# Patient Record
Sex: Male | Born: 1975
Health system: Southern US, Community
[De-identification: ages and names within clinical notes are randomized; demographics above are authoritative.]

## PROBLEM LIST (undated history)

## (undated) DIAGNOSIS — Z800 Family history of malignant neoplasm of digestive organs: Secondary | ICD-10-CM

## (undated) DIAGNOSIS — F1911 Other psychoactive substance abuse, in remission: Secondary | ICD-10-CM

## (undated) DIAGNOSIS — G4700 Insomnia, unspecified: Secondary | ICD-10-CM

## (undated) DIAGNOSIS — R21 Rash and other nonspecific skin eruption: Secondary | ICD-10-CM

## (undated) DIAGNOSIS — J310 Chronic rhinitis: Secondary | ICD-10-CM

## (undated) DIAGNOSIS — G8929 Other chronic pain: Secondary | ICD-10-CM

## (undated) DIAGNOSIS — M6740 Ganglion, unspecified site: Secondary | ICD-10-CM

## (undated) DIAGNOSIS — M549 Dorsalgia, unspecified: Secondary | ICD-10-CM

## (undated) HISTORY — DX: Rash and other nonspecific skin eruption: R21

## (undated) HISTORY — DX: Other psychoactive substance abuse, in remission: F19.11

## (undated) HISTORY — DX: Chronic rhinitis: J31.0

## (undated) HISTORY — DX: Family history of malignant neoplasm of digestive organs: Z80.0

## (undated) HISTORY — DX: Insomnia, unspecified: G47.00

## (undated) HISTORY — DX: Other chronic pain: G89.29

## (undated) HISTORY — DX: Dorsalgia, unspecified: M54.9

## (undated) HISTORY — PX: OTHER SURGICAL HISTORY: SHX169

## (undated) HISTORY — DX: Ganglion, unspecified site: M67.40

---

## 1997-09-30 ENCOUNTER — Emergency Department (HOSPITAL_COMMUNITY): Admission: EM | Admit: 1997-09-30 | Discharge: 1997-09-30 | Payer: Self-pay | Admitting: Emergency Medicine

## 1997-12-23 ENCOUNTER — Emergency Department (HOSPITAL_COMMUNITY): Admission: EM | Admit: 1997-12-23 | Discharge: 1997-12-23 | Payer: Self-pay | Admitting: Internal Medicine

## 1998-05-03 ENCOUNTER — Emergency Department (HOSPITAL_COMMUNITY): Admission: EM | Admit: 1998-05-03 | Discharge: 1998-05-03 | Payer: Self-pay | Admitting: Emergency Medicine

## 1998-05-03 ENCOUNTER — Encounter: Payer: Self-pay | Admitting: Emergency Medicine

## 1999-07-24 ENCOUNTER — Emergency Department (HOSPITAL_COMMUNITY): Admission: EM | Admit: 1999-07-24 | Discharge: 1999-07-24 | Payer: Self-pay | Admitting: Emergency Medicine

## 2003-07-15 ENCOUNTER — Inpatient Hospital Stay (HOSPITAL_COMMUNITY): Admission: EM | Admit: 2003-07-15 | Discharge: 2003-07-18 | Payer: Self-pay | Admitting: Psychiatry

## 2003-07-15 ENCOUNTER — Emergency Department (HOSPITAL_COMMUNITY): Admission: EM | Admit: 2003-07-15 | Discharge: 2003-07-15 | Payer: Self-pay | Admitting: Emergency Medicine

## 2004-03-05 ENCOUNTER — Emergency Department (HOSPITAL_COMMUNITY): Admission: EM | Admit: 2004-03-05 | Discharge: 2004-03-05 | Payer: Self-pay | Admitting: Emergency Medicine

## 2005-11-12 ENCOUNTER — Encounter: Admission: RE | Admit: 2005-11-12 | Discharge: 2006-02-10 | Payer: Self-pay | Admitting: Anesthesiology

## 2005-11-16 ENCOUNTER — Ambulatory Visit: Payer: Self-pay | Admitting: Anesthesiology

## 2005-11-16 ENCOUNTER — Encounter: Payer: Self-pay | Admitting: Family Medicine

## 2005-12-17 ENCOUNTER — Encounter: Admission: RE | Admit: 2005-12-17 | Discharge: 2005-12-17 | Payer: Self-pay | Admitting: Gastroenterology

## 2005-12-22 ENCOUNTER — Encounter: Admission: RE | Admit: 2005-12-22 | Discharge: 2005-12-22 | Payer: Self-pay | Admitting: Gastroenterology

## 2006-02-15 ENCOUNTER — Inpatient Hospital Stay (HOSPITAL_COMMUNITY): Admission: RE | Admit: 2006-02-15 | Discharge: 2006-02-18 | Payer: Self-pay | Admitting: Neurosurgery

## 2006-08-15 ENCOUNTER — Encounter: Admission: RE | Admit: 2006-08-15 | Discharge: 2006-11-13 | Payer: Self-pay | Admitting: Anesthesiology

## 2006-08-16 ENCOUNTER — Ambulatory Visit: Payer: Self-pay | Admitting: Anesthesiology

## 2006-10-18 ENCOUNTER — Ambulatory Visit: Payer: Self-pay | Admitting: Anesthesiology

## 2006-12-19 ENCOUNTER — Encounter: Admission: RE | Admit: 2006-12-19 | Discharge: 2006-12-20 | Payer: Self-pay | Admitting: Anesthesiology

## 2006-12-20 ENCOUNTER — Ambulatory Visit: Payer: Self-pay | Admitting: Anesthesiology

## 2006-12-20 ENCOUNTER — Encounter: Payer: Self-pay | Admitting: Family Medicine

## 2007-01-02 ENCOUNTER — Encounter
Admission: RE | Admit: 2007-01-02 | Discharge: 2007-04-02 | Payer: Self-pay | Admitting: Physical Medicine & Rehabilitation

## 2007-01-03 ENCOUNTER — Ambulatory Visit: Payer: Self-pay | Admitting: Physical Medicine & Rehabilitation

## 2007-02-08 ENCOUNTER — Ambulatory Visit: Payer: Self-pay | Admitting: Physical Medicine & Rehabilitation

## 2007-02-09 ENCOUNTER — Encounter: Payer: Self-pay | Admitting: Family Medicine

## 2007-03-09 ENCOUNTER — Ambulatory Visit: Payer: Self-pay | Admitting: Physical Medicine & Rehabilitation

## 2007-05-05 ENCOUNTER — Encounter
Admission: RE | Admit: 2007-05-05 | Discharge: 2007-05-08 | Payer: Self-pay | Admitting: Physical Medicine & Rehabilitation

## 2007-05-05 ENCOUNTER — Ambulatory Visit: Payer: Self-pay | Admitting: Physical Medicine & Rehabilitation

## 2007-07-27 ENCOUNTER — Encounter
Admission: RE | Admit: 2007-07-27 | Discharge: 2007-08-10 | Payer: Self-pay | Admitting: Physical Medicine & Rehabilitation

## 2007-08-10 ENCOUNTER — Ambulatory Visit: Payer: Self-pay | Admitting: Physical Medicine & Rehabilitation

## 2007-09-26 ENCOUNTER — Encounter: Payer: Self-pay | Admitting: Family Medicine

## 2007-11-01 ENCOUNTER — Encounter
Admission: RE | Admit: 2007-11-01 | Discharge: 2007-11-02 | Payer: Self-pay | Admitting: Physical Medicine & Rehabilitation

## 2007-11-02 ENCOUNTER — Ambulatory Visit: Payer: Self-pay | Admitting: Physical Medicine & Rehabilitation

## 2008-01-24 ENCOUNTER — Encounter
Admission: RE | Admit: 2008-01-24 | Discharge: 2008-04-23 | Payer: Self-pay | Admitting: Physical Medicine & Rehabilitation

## 2008-01-25 ENCOUNTER — Ambulatory Visit: Payer: Self-pay | Admitting: Physical Medicine & Rehabilitation

## 2008-03-07 ENCOUNTER — Encounter: Payer: Self-pay | Admitting: Family Medicine

## 2008-03-07 ENCOUNTER — Ambulatory Visit: Payer: Self-pay | Admitting: Physical Medicine & Rehabilitation

## 2008-05-03 ENCOUNTER — Encounter
Admission: RE | Admit: 2008-05-03 | Discharge: 2008-05-03 | Payer: Self-pay | Admitting: Physical Medicine & Rehabilitation

## 2008-05-03 ENCOUNTER — Ambulatory Visit: Payer: Self-pay | Admitting: Physical Medicine & Rehabilitation

## 2008-07-24 ENCOUNTER — Encounter
Admission: RE | Admit: 2008-07-24 | Discharge: 2008-10-22 | Payer: Self-pay | Admitting: Physical Medicine & Rehabilitation

## 2008-08-05 ENCOUNTER — Ambulatory Visit: Payer: Self-pay | Admitting: Physical Medicine & Rehabilitation

## 2008-09-17 ENCOUNTER — Encounter: Payer: Self-pay | Admitting: Family Medicine

## 2008-09-17 ENCOUNTER — Ambulatory Visit: Payer: Self-pay | Admitting: Physical Medicine & Rehabilitation

## 2008-11-12 ENCOUNTER — Encounter
Admission: RE | Admit: 2008-11-12 | Discharge: 2008-11-19 | Payer: Self-pay | Admitting: Physical Medicine & Rehabilitation

## 2008-11-19 ENCOUNTER — Ambulatory Visit: Payer: Self-pay | Admitting: Physical Medicine & Rehabilitation

## 2008-11-19 ENCOUNTER — Encounter: Payer: Self-pay | Admitting: Family Medicine

## 2008-12-03 ENCOUNTER — Encounter: Payer: Self-pay | Admitting: Family Medicine

## 2009-06-30 ENCOUNTER — Encounter: Payer: Self-pay | Admitting: Family Medicine

## 2009-10-08 ENCOUNTER — Telehealth: Payer: Self-pay | Admitting: Family Medicine

## 2009-10-16 ENCOUNTER — Ambulatory Visit: Payer: Self-pay | Admitting: Family Medicine

## 2009-10-16 DIAGNOSIS — M549 Dorsalgia, unspecified: Secondary | ICD-10-CM | POA: Insufficient documentation

## 2009-10-16 DIAGNOSIS — G4700 Insomnia, unspecified: Secondary | ICD-10-CM | POA: Insufficient documentation

## 2009-11-18 ENCOUNTER — Telehealth: Payer: Self-pay | Admitting: Family Medicine

## 2009-11-24 ENCOUNTER — Ambulatory Visit: Payer: Self-pay | Admitting: Family Medicine

## 2009-11-24 DIAGNOSIS — L989 Disorder of the skin and subcutaneous tissue, unspecified: Secondary | ICD-10-CM | POA: Insufficient documentation

## 2009-11-24 DIAGNOSIS — R21 Rash and other nonspecific skin eruption: Secondary | ICD-10-CM

## 2009-12-16 ENCOUNTER — Ambulatory Visit: Payer: Self-pay | Admitting: Family Medicine

## 2009-12-16 DIAGNOSIS — B9789 Other viral agents as the cause of diseases classified elsewhere: Secondary | ICD-10-CM | POA: Insufficient documentation

## 2009-12-18 ENCOUNTER — Telehealth: Payer: Self-pay | Admitting: Family Medicine

## 2009-12-23 ENCOUNTER — Telehealth: Payer: Self-pay | Admitting: Family Medicine

## 2010-01-16 ENCOUNTER — Telehealth: Payer: Self-pay | Admitting: Family Medicine

## 2010-01-21 ENCOUNTER — Telehealth: Payer: Self-pay | Admitting: Family Medicine

## 2010-02-17 ENCOUNTER — Telehealth: Payer: Self-pay | Admitting: Family Medicine

## 2010-02-23 ENCOUNTER — Telehealth: Payer: Self-pay | Admitting: Family Medicine

## 2010-03-19 ENCOUNTER — Telehealth: Payer: Self-pay | Admitting: Family Medicine

## 2010-03-24 ENCOUNTER — Telehealth: Payer: Self-pay | Admitting: Family Medicine

## 2010-04-17 ENCOUNTER — Telehealth: Payer: Self-pay | Admitting: Family Medicine

## 2010-05-04 ENCOUNTER — Ambulatory Visit
Admission: RE | Admit: 2010-05-04 | Discharge: 2010-05-04 | Payer: Self-pay | Source: Home / Self Care | Attending: Family Medicine | Admitting: Family Medicine

## 2010-05-04 DIAGNOSIS — M6740 Ganglion, unspecified site: Secondary | ICD-10-CM | POA: Insufficient documentation

## 2010-05-04 DIAGNOSIS — J310 Chronic rhinitis: Secondary | ICD-10-CM | POA: Insufficient documentation

## 2010-05-05 NOTE — Letter (Signed)
Summary: [name] @ [facility] [employer]   Imported By: Lanelle Bal 11/10/2009 09:02:14  _____________________________________________________________________  External Attachment:    Type:   Image     Comment:   External Document

## 2010-05-05 NOTE — Progress Notes (Signed)
Summary: refill request for norco  Phone Note Refill Request Message from:  Fax from Pharmacy  Refills Requested: Medication #1:  NORCO 10-325 MG TABS Take 1/2 tablet by mouth twice a day   Last Refilled: 12/18/2009 Faxed request from [facility] [address], [phone]  Initial call taken by: Lowella Petties CMA,  January 16, 2010 8:57 AM  Follow-up for Phone Call        please call in and notify patient.  Follow-up by: Crawford Givens MD,  January 16, 2010 1:40 PM  Additional Follow-up for Phone Call Additional follow up Details #1::        Rx called in as directed. Message left notifying patient on home machine Additional Follow-up by: Janee Morn CMA Duncan Dull),  January 16, 2010 2:25 PM    Prescriptions: NORCO 10-325 MG TABS (HYDROCODONE-ACETAMINOPHEN) Take 1/2 tablet by mouth twice a day  #30 x 0   Entered and Authorized by:   Crawford Givens MD   Signed by:   Crawford Givens MD on 01/16/2010   Method used:   Telephoned to ...       [facility] #[id number]* (retail)       [address]       [place], [place]  [address]       Ph: [phone]       Fax: [phone]   RxID:   [id number]

## 2010-05-05 NOTE — Assessment & Plan Note (Signed)
Summary: RASHES   Vital Signs:  Patient profile:   35 year old male Height:      74 inches Weight:      222.25 pounds BMI:     28.64 Temp:     97.4 degrees F oral Pulse rate:   92 / minute Pulse rhythm:   regular BP sitting:   92 / 64  (left arm) Cuff size:   large  Vitals Entered By: Delilah Shan CMA Duncan Dull) (November 24, 2009 10:49 AM) CC: Rashes   History of Present Illness: Started in last 2-3 days.  Itchy spot on L wrist, then on R ankle and B at waistline.  Itches less at night.  Itching comes and goes.  No FCNAV.  Was at golf tournament and had no known environmental exposure that could have caused it.  No new soaps or other new foods.  No known triggers.    Allergies: No Known Drug Allergies  Review of Systems       See HPI.  Otherwise negative.    Physical Exam  General:  NAD RRR CTAB skin with maculopapular lesions/patches on waist at bilateral midaxillary line.  Similary blanching patch on the R ankle, inferior to lat mal.  Small excoriated lesion on wrist noted.    Impression & Recommendations:  Problem # 1:  SKIN RASH (ICD-782.1)  Pt to use TAC and claritin as needed.  I think this is likely a self resolving dermatitis but these may help with symptoms in the meantime.  follow up as needed. He agrees.  Steroid precautions given.   His updated medication list for this problem includes:    Triamcinolone Acetonide 0.1 % Crea (Triamcinolone acetonide) .Marland Kitchen... Aaa two or three times a day  Complete Medication List: 1)  Tramadol Hcl 50 Mg Tabs (Tramadol hcl) .... 2 by mouth four times a day x 30 days 2)  Norco 10-325 Mg Tabs (Hydrocodone-acetaminophen) .... Take 1/2 tablet by mouth twice a day 3)  Flexeril 10 Mg Tabs (Cyclobenzaprine hcl) .... Take 1 to 1.5 tablet by mouth once a day for spasm or for sleep 4)  Multivitamins Tabs (Multiple vitamin) .... Take 1 tablet by mouth once a day 5)  Garlic Powd (Garlic) .... Take 1 tablet by mouth once a day 6)  Fish Oil  Oil (Fish oil) .... Once daily 7)  Aleve 220 Mg Tabs (Naproxen sodium) .... Takes 3 tabs daily 8)  Zantac 150 Mg Tabs (Ranitidine hcl) .... Take 1 tablet by mouth once a day 9)  Celexa 40 Mg Tabs (Citalopram hydrobromide) .Marland Kitchen.. 1 by mouth qday 10)  Triamcinolone Acetonide 0.1 % Crea (Triamcinolone acetonide) .... Aaa two or three times a day  Patient Instructions: 1)  Call back next week if you aren't doing better.  Use claritin 10mg  a day and the cream as needed.   Prescriptions: TRIAMCINOLONE ACETONIDE 0.1 % CREA (TRIAMCINOLONE ACETONIDE) AAA two or three times a day  #15g x 2   Entered and Authorized by:   Crawford Givens MD   Signed by:   Crawford Givens MD on 11/24/2009   Method used:   Electronically to        [facility] #[id number]* (retail)       [address]       [place], [place]  [address]       Ph: [phone]       Fax: [phone]   RxID:  [id number]   Current Allergies (reviewed today): No known allergies

## 2010-05-05 NOTE — Progress Notes (Signed)
Summary: hydrocodone  Phone Note Refill Request Message from:  Fax from Pharmacy on February 17, 2010 10:29 AM  Refills Requested: Medication #1:  NORCO 10-325 MG TABS Take 1/2 tablet by mouth twice a day   Last Refilled: 01/16/2010 Refill request from [facility] [address] [phone].   Initial call taken by: Melody Comas,  February 17, 2010 10:40 AM  Follow-up for Phone Call        please call in.  Follow-up by: Crawford Givens MD,  February 17, 2010 1:02 PM  Additional Follow-up for Phone Call Additional follow up Details #1::        Medication phoned to pharmacy.  Additional Follow-up by: Delilah Shan CMA Duncan Dull),  February 17, 2010 3:18 PM    Prescriptions: NORCO 10-325 MG TABS (HYDROCODONE-ACETAMINOPHEN) Take 1/2 tablet by mouth twice a day  #30 x 0   Entered and Authorized by:   Crawford Givens MD   Signed by:   Crawford Givens MD on 02/17/2010   Method used:   Telephoned to ...       [facility] #[id number]* (retail)       [address]       [place], [place]  [address]       Ph: [phone]       Fax: [phone]   RxID:   [id number]

## 2010-05-05 NOTE — Progress Notes (Signed)
Summary: tramadol    Phone Note Refill Request Message from:  Fax from Pharmacy on February 23, 2010 9:49 AM  Refills Requested: Medication #1:  TRAMADOL HCL 50 MG TABS 2 by mouth four times a day x 30 days   Last Refilled: 01/21/2010 Refill request from [facility] [phone].   Initial call taken by: Melody Comas,  February 23, 2010 9:50 AM  Follow-up for Phone Call        please call in.  Follow-up by: Crawford Givens MD,  February 23, 2010 12:33 PM  Additional Follow-up for Phone Call Additional follow up Details #1::        Medication phoned to pharmacy.  Additional Follow-up by: Delilah Shan CMA Duncan Dull),  February 23, 2010 3:10 PM    Prescriptions: TRAMADOL HCL 50 MG TABS (TRAMADOL HCL) 2 by mouth four times a day x 30 days  #240 x 0   Entered and Authorized by:   Crawford Givens MD   Signed by:   Crawford Givens MD on 02/23/2010   Method used:   Telephoned to ...       [facility] #[id number]* (retail)       [address]       [place], [place]  [address]       Ph: [phone]       Fax: [phone]   RxID:   [id number]

## 2010-05-05 NOTE — Progress Notes (Signed)
Summary: refill requests for tramadol, norco  Phone Note Refill Request Message from:  Fax from Pharmacy  Refills Requested: Medication #1:  TRAMADOL HCL 50 MG TABS 2 by mouth four times a day x 30 days   Last Refilled: 09/15/2009  Medication #2:  NORCO 10-325 MG TABS Take 1/2 tablet by mouth twice a day   Last Refilled: 10/16/2009 Faxed requests from [facility] [address], [phone]  Initial call taken by: Lowella Petties CMA,  November 18, 2009 9:41 AM  Follow-up for Phone Call        signed,  okay to mail to patient if he prefers.  Follow-up by: Crawford Givens MD,  November 18, 2009 10:29 AM  Additional Follow-up for Phone Call Additional follow up Details #1::        Faxed to pharmacy Additional Follow-up by: Delilah Shan CMA Duncan Dull),  November 18, 2009 11:35 AM    Prescriptions: NORCO 10-325 MG TABS (HYDROCODONE-ACETAMINOPHEN) Take 1/2 tablet by mouth twice a day  #30 x 0   Entered and Authorized by:   Crawford Givens MD   Signed by:   Crawford Givens MD on 11/18/2009   Method used:   Print then Give to Patient   RxID:   [id number] TRAMADOL HCL 50 MG TABS (TRAMADOL HCL) 2 by mouth four times a day x 30 days  #240 x 0   Entered and Authorized by:   Crawford Givens MD   Signed by:   Crawford Givens MD on 11/18/2009   Method used:   Print then Give to Patient   RxID:   [id number]

## 2010-05-05 NOTE — Op Note (Signed)
Summary: [facility] Steroid [facility]  [facility] Steroid Injection/[facility]   Imported By: Lanelle Bal 11/10/2009 08:57:25  _____________________________________________________________________  External Attachment:    Type:   Image     Comment:   External Document

## 2010-05-05 NOTE — Miscellaneous (Signed)
Summary: Controlled Substances Contract  Controlled Substances Contract   Imported By: Maryln Gottron 10/23/2009 15:52:44  _____________________________________________________________________  External Attachment:    Type:   Image     Comment:   External Document

## 2010-05-05 NOTE — Op Note (Signed)
Summary: Lumbar Epidural/[facility]  Lumbar Epidural/[facility]   Imported By: Lanelle Bal 11/10/2009 08:55:35  _____________________________________________________________________  External Attachment:    Type:   Image     Comment:   External Document

## 2010-05-05 NOTE — Letter (Signed)
Summary: [name] @ [facility] [employer]   Imported By: Lanelle Bal 11/10/2009 09:03:11  _____________________________________________________________________  External Attachment:    Type:   Image     Comment:   External Document

## 2010-05-05 NOTE — Letter (Signed)
Summary: [facility]  [facility]   Imported By: Lanelle Bal 11/10/2009 08:59:55  _____________________________________________________________________  External Attachment:    Type:   Image     Comment:   External Document

## 2010-05-05 NOTE — Progress Notes (Signed)
Summary: needs refill on tramadol  Phone Note Call from Patient Call back at [phone]   Caller: Patient Call For: Dr. Para March Summary of Call: Pt is a pt of yours from [facility], he has his first appt here next week.  He is running out of tramadol and needs a written prescription to pick up.  He takes 50 mg's, 2 tablets four times a day.             Lowella Petties CMA  October 08, 2009 9:30 AM   Follow-up for Phone Call        please send in rx for tramadol 50mg , 2 by mouth qid x30 days.  #240.  no rf.  thanks.  Follow-up by: Crawford Givens MD,  October 08, 2009 2:04 PM  Additional Follow-up for Phone Call Additional follow up Details #1::        Patient Advised.   Patient would like it printed for pick up.  He is not sure which pharmacy he'll use and will be in the area this afternoon. Additional Follow-up by: Delilah Shan CMA Nikala Walsworth Dull),  October 08, 2009 2:32 PM    Additional Follow-up for Phone Call Additional follow up Details #2::    printed.  signed.  Follow-up by: Crawford Givens MD,  October 08, 2009 2:38 PM  New/Updated Medications: TRAMADOL HCL 50 MG TABS (TRAMADOL HCL) 2 by mouth qid x30 days Prescriptions: TRAMADOL HCL 50 MG TABS (TRAMADOL HCL) 2 by mouth qid x30 days  #240 x 0   Entered and Authorized by:   Crawford Givens MD   Signed by:   Crawford Givens MD on 10/08/2009   Method used:   Print then Give to Patient   RxID:   [id number]

## 2010-05-05 NOTE — Progress Notes (Signed)
Summary: refill request for tramadol  Phone Note Refill Request Message from:  Fax from Pharmacy  Refills Requested: Medication #1:  TRAMADOL HCL 50 MG TABS 2 by mouth four times a day x 30 days   Last Refilled: 12/23/2009 Faxed request from [facility] [address], [phone].  Initial call taken by: Lowella Petties CMA,  January 21, 2010 11:06 AM  Follow-up for Phone Call        please call in.  Follow-up by: Crawford Givens MD,  January 21, 2010 11:22 AM  Additional Follow-up for Phone Call Additional follow up Details #1::        Medication phoned to pharmacy.  Additional Follow-up by: Delilah Shan CMA Duncan Dull),  January 21, 2010 1:47 PM    Prescriptions: TRAMADOL HCL 50 MG TABS (TRAMADOL HCL) 2 by mouth four times a day x 30 days  #240 x 0   Entered and Authorized by:   Crawford Givens MD   Signed by:   Crawford Givens MD on 01/21/2010   Method used:   Telephoned to ...       [facility] #[id number]* (retail)       [address]       [place], [place]  [address]       Ph: [phone]       Fax: [phone]   RxID:   [id number]

## 2010-05-05 NOTE — Assessment & Plan Note (Signed)
Summary: TRANSFER FROM [facility]   Vital Signs:  Patient profile:   35 year old male Height:      74 inches Weight:      218.25 pounds BMI:     28.12 Temp:     98.1 degrees F oral Pulse rate:   92 / minute Pulse rhythm:   regular BP sitting:   104 / 70  (left arm) Cuff size:   large  Vitals Entered By: Delilah Shan CMA  Dull) (October 16, 2009 2:58 PM) CC: Transfer from [facility]   History of Present Illness: Back pain.  Long standing.  Prev Lumbar surgery and was seen at pain clinic.  on stable regimen of meds wtih good relief of pain as long as he is active and stretching.  No current alcohol/illicit use.  Pain is in lower lumbar area in midline.  No new neurologic symptoms.   Trouble sleeping.  Going on for about 6 months to 1 year.  Taking flexeril at night.  Has cut out caffeine in the afternoon.  Trouble getting to sleep and staying asleep.  Waking up tired the next morning.  No manic symptoms.  Mood had been good on celexa 40mg .  Had been taking it at night.  Pain not contributing to sleep changes.  No help with melatonin or benadryl in terms of staying asleep.    Preventive Screening-Counseling & Management  Alcohol-Tobacco     Smoking Status: never  Caffeine-Diet-Exercise     Does Patient Exercise: yes      Drug Use:  no.    Current Medications (verified): 1)  Tramadol Hcl 50 Mg Tabs (Tramadol Hcl) .... 2 By Mouth Four Times A Day X 30 Days 2)  Norco 10-325 Mg Tabs (Hydrocodone-Acetaminophen) .... Take 1/2 Tablet By Mouth Twice A Day 3)  Flexeril 10 Mg Tabs (Cyclobenzaprine Hcl) .... Take 1 Tablet By Mouth Once A Day 4)  Multivitamins   Tabs (Multiple Vitamin) .... Take 1 Tablet By Mouth Once A Day 5)  Garlic   Powd (Garlic) .... Take 1 Tablet By Mouth Once A Day 6)  Fish Oil   Oil (Fish Oil) .... Once Daily 7)  Aleve 220 Mg Tabs (Naproxen Sodium) .... Takes 3 Tabs Daily 8)  Zantac 150 Mg Tabs (Ranitidine Hcl) .... Take 1 Tablet By Mouth Once A Day  Allergies  (verified): No Known Drug Allergies  Past History:  Past Medical History: Chronic back pain- prev pain clinic eval and treatment  Depression responsive to celexa H/o substance abuse, sober for years as of 2011. Has been responsible with rx meds.   Past Surgical History: 2 level back fusion  Family History: Family History of Arthritis, parents Family History of Colon CA 1st degree relative <60, grandparents Family History High cholesterol, parents Family History Hypertension, parents, grandparents Family History of Stroke F 1st degree relative <60, grandparents Family History of Heart Disease, grandparents  Social History: Marital Status: Married Children:  Occupation:  Never Smoked Alcohol use-no Drug use-no Regular exercise-yes Smoking Status:  never Drug Use:  no Does Patient Exercise:  yes  Physical Exam  General:  GEN: nad, alert and oriented HEENT: mucous membranes moist NECK: supple w/o LA CV: rrr.  no murmur PULM: ctab, no inc wob ABD: soft, +bs EXT: no edema, 2+ dtrs SKIN: no acute rash  Back: lumbar scan note.  Pain with combination of lateral and forward flexion.  no superficial muscle tenderness.    Impression & Recommendations:  Problem # 1:  BACK  PAIN (ICD-724.5) continue current meds.  Med contract done.  Continue stretching.   Call back when needing refills.  His updated medication list for this problem includes:    Tramadol Hcl 50 Mg Tabs (Tramadol hcl) .Marland Kitchen... 2 by mouth four times a day x 30 days    Norco 10-325 Mg Tabs (Hydrocodone-acetaminophen) .Marland Kitchen... Take 1/2 tablet by mouth twice a day    Flexeril 10 Mg Tabs (Cyclobenzaprine hcl) .Marland Kitchen... Take 1 to 1.5 tablet by mouth once a day for spasm or for sleep    Aleve 220 Mg Tabs (Naproxen sodium) .Marland Kitchen... Takes 3 tabs daily  Problem # 2:  INSOMNIA-SLEEP DISORDER-UNSPEC (ICD-780.52) Would have patient take flexeril 10mg  by mouth at bedtime and and extra 5mg  as needed, either at bedtime or if he wakes up  in the middle of the night.  Would avoid ambien and other similar agents.  He agrees and will update me as needed.    Complete Medication List: 1)  Tramadol Hcl 50 Mg Tabs (Tramadol hcl) .... 2 by mouth four times a day x 30 days 2)  Norco 10-325 Mg Tabs (Hydrocodone-acetaminophen) .... Take 1/2 tablet by mouth twice a day 3)  Flexeril 10 Mg Tabs (Cyclobenzaprine hcl) .... Take 1 to 1.5 tablet by mouth once a day for spasm or for sleep 4)  Multivitamins Tabs (Multiple vitamin) .... Take 1 tablet by mouth once a day 5)  Garlic Powd (Garlic) .... Take 1 tablet by mouth once a day 6)  Fish Oil Oil (Fish oil) .... Once daily 7)  Aleve 220 Mg Tabs (Naproxen sodium) .... Takes 3 tabs daily 8)  Zantac 150 Mg Tabs (Ranitidine hcl) .... Take 1 tablet by mouth once a day 9)  Celexa 40 Mg Tabs (Citalopram hydrobromide) .Marland Kitchen.. 1 by mouth qday  Patient Instructions: 1)  call back about your refill for norco in 1 month.  Take up to 15 mg of flexeril a day for spasm or for sleep.  Let me know if you continue to have trouble.  Take care.  Prescriptions: CELEXA 40 MG TABS (CITALOPRAM HYDROBROMIDE) 1 by mouth qday  #90 x 3   Entered and Authorized by:   Crawford Givens MD   Signed by:   Crawford Givens MD on 10/16/2009   Method used:   Print then Give to Patient   RxID:   [id number] NORCO 10-325 MG TABS (HYDROCODONE-ACETAMINOPHEN) Take 1/2 tablet by mouth twice a day  #30 x 0   Entered and Authorized by:   Crawford Givens MD   Signed by:   Crawford Givens MD on 10/16/2009   Method used:   Print then Give to Patient   RxID:   [id number] FLEXERIL 10 MG TABS (CYCLOBENZAPRINE HCL) Take 1 to 1.5 tablet by mouth once a day for spasm or for sleep  #135 x 1   Entered and Authorized by:   Crawford Givens MD   Signed by:   Crawford Givens MD on 10/16/2009   Method used:   Print then Give to Patient   RxID:   [id number]   Current Allergies (reviewed today): No known allergies

## 2010-05-05 NOTE — Letter (Signed)
Summary: [facility]  [facility]   Imported By: Lanelle Bal 11/10/2009 08:54:24  _____________________________________________________________________  External Attachment:    Type:   Image     Comment:   External Document

## 2010-05-05 NOTE — Op Note (Signed)
Summary: Epidural Steroid Injection/[facility]  Epidural Steroid Injection/[facility]   Imported By: Lanelle Bal 11/10/2009 08:56:34  _____________________________________________________________________  External Attachment:    Type:   Image     Comment:   External Document

## 2010-05-05 NOTE — Progress Notes (Signed)
Summary: refill request for norco  Phone Note Refill Request Message from:  Fax from Pharmacy  Refills Requested: Medication #1:  NORCO 10-325 MG TABS Take 1/2 tablet by mouth twice a day   Last Refilled: 11/18/2009 Faxed request from [facility] [address], [phone]  Initial call taken by: Lowella Petties CMA,  December 18, 2009 11:55 AM  Follow-up for Phone Call        please call in.  #30, no rf.  thanks.  Follow-up by: Crawford Givens MD,  December 18, 2009 1:39 PM  Additional Follow-up for Phone Call Additional follow up Details #1::        Rx called to pharmacy Additional Follow-up by: Sydell Axon LPN,  December 18, 2009 5:01 PM    Prescriptions: NORCO 10-325 MG TABS (HYDROCODONE-ACETAMINOPHEN) Take 1/2 tablet by mouth twice a day  #30 x 0   Entered and Authorized by:   Crawford Givens MD   Signed by:   Crawford Givens MD on 12/18/2009   Method used:   Telephoned to ...       [facility] #[id number]* (retail)       [address]       [place], [place]  [address]       Ph: [phone]       Fax: [phone]   RxID:   [id number]

## 2010-05-05 NOTE — Letter (Signed)
Summary: [facility]  [facility]   Imported By: Lanelle Bal 11/10/2009 09:01:21  _____________________________________________________________________  External Attachment:    Type:   Image     Comment:   External Document

## 2010-05-05 NOTE — Progress Notes (Signed)
Summary: refill request for tramadol  Phone Note Refill Request Message from:  Patient  Refills Requested: Medication #1:  TRAMADOL HCL 50 MG TABS 2 by mouth four times a day x 30 days Phoned request from pt, please send to [facility] [address], [phone].  Initial call taken by: Lowella Petties CMA,  December 23, 2009 2:03 PM  Follow-up for Phone Call        Rx sent.  Follow-up by: Crawford Givens MD,  December 23, 2009 2:44 PM    Prescriptions: TRAMADOL HCL 50 MG TABS (TRAMADOL HCL) 2 by mouth four times a day x 30 days  #240 x 0   Entered and Authorized by:   Crawford Givens MD   Signed by:   Crawford Givens MD on 12/23/2009   Method used:   Electronically to        [facility] #[id number]* (retail)       [address]       [place], [place]  [address]       Ph: [phone]       Fax: [phone]   RxID:   [id number]

## 2010-05-05 NOTE — Assessment & Plan Note (Signed)
Summary: [name]   Vital Signs:  Patient profile:   35 year old male Height:      74 inches Weight:      222.25 pounds BMI:     28.64 Temp:     97.4 degrees F oral Pulse rate:   84 / minute Pulse rhythm:   regular BP sitting:   102 / 70  (left arm) Cuff size:   large  Vitals Entered By: Delilah Shan CMA Duncan Dull) (December 16, 2009 10:20 AM) CC: Ears   History of Present Illness: Was okay until a few days ago.  Now queasy and having room spinning/shifting with head movement.  No vomiting.  Stuffy but this is common for patient.  Daughter and wife have been sick.  No CP/SOB/cough.  No fevers but a few sweats yesterday.    Allergies: No Known Drug Allergies  Review of Systems       See HPI.  Otherwise negative.    Physical Exam  General:  GEN: nad, alert and oriented HEENT: mucous membranes moist, TM w/o erythema, nasal epithelium injected, OP with cobblestoning NECK: supple w/o LA CV: rrr. PULM: ctab, no inc wob ABD: soft, +bs EXT: no edema CN 2-12 wnl B, no vertigo with eye tracking   Impression & Recommendations:  Problem # 1:  VIRAL INFECTION (ICD-079.99) Supportive care and follow up as needed.  This should gradually resolve . Benign exam.  Rest and fluids.  claritin as needed for congestion.  His updated medication list for this problem includes:    Aleve 220 Mg Tabs (Naproxen sodium) .Marland Kitchen... Takes 3 tabs daily  Complete Medication List: 1)  Tramadol Hcl 50 Mg Tabs (Tramadol hcl) .... 2 by mouth four times a day x 30 days 2)  Norco 10-325 Mg Tabs (Hydrocodone-acetaminophen) .... Take 1/2 tablet by mouth twice a day 3)  Flexeril 10 Mg Tabs (Cyclobenzaprine hcl) .... Take 1 to 1.5 tablet by mouth once a day for spasm or for sleep 4)  Multivitamins Tabs (Multiple vitamin) .... Take 1 tablet by mouth once a day 5)  Garlic Powd (Garlic) .... Take 1 tablet by mouth once a day 6)  Fish Oil Oil (Fish oil) .... Once daily 7)  Aleve 220 Mg Tabs (Naproxen sodium) ....  Takes 3 tabs daily 8)  Zantac 150 Mg Tabs (Ranitidine hcl) .... Take 1 tablet by mouth once a day 9)  Celexa 40 Mg Tabs (Citalopram hydrobromide) .Marland Kitchen.. 1 by mouth qday 10)  Triamcinolone Acetonide 0.1 % Crea (Triamcinolone acetonide) .... Aaa two or three times a day  Patient Instructions: 1)  I would get some rest and take claritin for your congestion.  This should gradually get better, but it may take a week or longer to get well.    Current Allergies (reviewed today): No known allergies

## 2010-05-07 NOTE — Progress Notes (Signed)
Summary: refill request for norco  Phone Note Refill Request Message from:  Fax from Pharmacy  Refills Requested: Medication #1:  NORCO 10-325 MG TABS Take 1/2 tablet by mouth twice a day   Last Refilled: 02/17/2010 Faxed request from [facility] [address], [phone].  Initial call taken by: Lowella Petties CMA, AAMA,  March 19, 2010 8:12 AM  Follow-up for Phone Call        please call in.  thanks.  Follow-up by: Crawford Givens MD,  March 19, 2010 11:12 AM  Additional Follow-up for Phone Call Additional follow up Details #1::        Medication phoned to pharmacy.  Additional Follow-up by: Delilah Shan CMA Duncan Dull),  March 19, 2010 11:37 AM    Prescriptions: NORCO 10-325 MG TABS (HYDROCODONE-ACETAMINOPHEN) Take 1/2 tablet by mouth twice a day  #30 x 0   Entered and Authorized by:   Crawford Givens MD   Signed by:   Crawford Givens MD on 03/19/2010   Method used:   Telephoned to ...       [facility] [address] #[id number]* (retail)       [address]       [place], [place]  [address]       Ph: [phone]       Fax: [phone]   RxID:   [id number]

## 2010-05-07 NOTE — Progress Notes (Signed)
Summary: norco  Phone Note Refill Request Message from:  Fax from Pharmacy on April 17, 2010 2:29 PM  Refills Requested: Medication #1:  NORCO 10-325 MG TABS Take 1/2 tablet by mouth twice a day   Last Refilled: 03/19/2010 Refill request from [facility] [address] [phone]  Initial call taken by: Melody Comas,  April 17, 2010 2:30 PM  Follow-up for Phone Call        please call both in.  I went ahead and did the tramadol at the same time to make the rxs line up together.  I'd like to see him this spring just to check up on his back, etc.  appointment.  thanks.  Follow-up by: Crawford Givens MD,  April 17, 2010 2:45 PM  Additional Follow-up for Phone Call Additional follow up Details #1::        Medication phoned to pharmacy. Patient Advised.  Additional Follow-up by: Delilah Shan CMA (AAMA),  April 17, 2010 3:02 PM    Prescriptions: NORCO 10-325 MG TABS (HYDROCODONE-ACETAMINOPHEN) Take 1/2 tablet by mouth twice a day  #30 x 0   Entered and Authorized by:   Crawford Givens MD   Signed by:   Crawford Givens MD on 04/17/2010   Method used:   Telephoned to ...       [facility] [phone]* (retail)       [address]       [place], [place]  [address]       Ph: [phone]       Fax: [phone]   RxID:   [id number] TRAMADOL HCL 50 MG TABS (TRAMADOL HCL) 2 by mouth four times a day x 30 days  #240 x 0   Entered and Authorized by:   Crawford Givens MD   Signed by:   Crawford Givens MD on 04/17/2010   Method used:   Telephoned to ...       [facility] #[id number]* (retail)       [address]       [place], [place]  [address]       Ph: [phone]       Fax: [phone]   RxID:   [id number]

## 2010-05-07 NOTE — Progress Notes (Signed)
Summary: refill request for tramadol  Phone Note Refill Request Message from:  Patient  Refills Requested: Medication #1:  TRAMADOL HCL 50 MG TABS 2 by mouth four times a day x 30 days Please send to [facility] [address], [phone].  Initial call taken by: Lowella Petties CMA, AAMA,  March 24, 2010 4:58 PM  Follow-up for Phone Call        sent. Follow-up by: Crawford Givens MD,  March 24, 2010 5:00 PM    Prescriptions: TRAMADOL HCL 50 MG TABS (TRAMADOL HCL) 2 by mouth four times a day x 30 days  #240 x 0   Entered and Authorized by:   Crawford Givens MD   Signed by:   Crawford Givens MD on 03/24/2010   Method used:   Electronically to        [facility] #[id number]* (retail)       [address]       [place], [place]  [address]       Ph: [phone]       Fax: [phone]   RxID:   [id number]

## 2010-05-07 NOTE — Progress Notes (Signed)
Summary: Flexeril  Phone Note Call from Patient Call back at Home Phone [phone]   Caller: Patient Call For: Clayton Givens MD Summary of Call: Patient says he also needs a refill on his Flexeril.  Can you okay that as well so that it conincides with the Norco and Tramadol?  [facility] [address]. Initial call taken by: Delilah Shan CMA Duncan Dull),  April 17, 2010 3:07 PM  Follow-up for Phone Call        please send in.  thanks.  Follow-up by: Clayton Givens MD,  April 17, 2010 3:16 PM    Prescriptions: FLEXERIL 10 MG TABS (CYCLOBENZAPRINE HCL) Take 1 to 1.5 tablet by mouth once a day for spasm or for sleep  #135 x 1   Entered by:   Delilah Shan CMA (AAMA)   Authorized by:   Clayton Givens MD   Signed by:   Delilah Shan CMA (AAMA) on 04/17/2010   Method used:   Electronically to        [facility] #[id number]* (retail)       [address]       [place], [place]  [address]       Ph: [phone]       Fax: [phone]   RxID:   [id number] FLEXERIL 10 MG TABS (CYCLOBENZAPRINE HCL) Take 1 to 1.5 tablet by mouth once a day for spasm or for sleep  #135 x 1   Entered and Authorized by:   Clayton Givens MD   Signed by:   Clayton Givens MD on 04/17/2010   Method used:   Telephoned to ...       [facility] #[id number]* (retail)       [address]       [place], [place]  [address]       Ph: [phone]       Fax: [phone]   RxID:   [id number]

## 2010-05-13 NOTE — Assessment & Plan Note (Signed)
Summary: CHECK LUMP ON WRIST/CLE   Vital Signs:  Patient profile:   35 year old male Height:      74 inches Weight:      227.50 pounds BMI:     29.31 Temp:     97.6 degrees F oral Pulse rate:   92 / minute Pulse rhythm:   regular BP sitting:   104 / 70  (left arm) Cuff size:   large  Vitals Entered By: Delilah Shan CMA Agapita Savarino Dull) (May 04, 2010 4:22 PM) CC: Check lump on wrist   History of Present Illness: Mass on L wrist.  Not tender to palpation.  present for a few days.  On extensor L wrist.  No other similar lesions.    Persistent nasal congestion and h/o allergy symptoms.  Congestion affects sleep.  No fevers.  Present year round.   Allergies: No Known Drug Allergies  Review of Systems       See HPI.  Otherwise negative.    Physical Exam  General:  normocephalic atraumatic mucous membranes moist nasal epithelium edematous and with clear rhinorrhea tm wnl bilaterally L wrist with  ~1cm mass c/w ganglion cyst.    Impression & Recommendations:  Problem # 1:  RHINITIS (ICD-472.0) Start flonase and follow up as needed.  Orders: Prescription Created Electronically [id number])  Problem # 2:  GANGLION CYST, WRIST, LEFT (ICD-727.41) No tx needed.  D/w patient.  I would not aspirate since it is small and not causing symptoms.  Call back as needed. He agrees.  Orders: Prescription Created Electronically [id number])  Complete Medication List: 1)  Tramadol Hcl 50 Mg Tabs (Tramadol hcl) .... 2 by mouth four times a day x 30 days 2)  Norco 10-325 Mg Tabs (Hydrocodone-acetaminophen) .... Take 1/2 tablet by mouth twice a day 3)  Flexeril 10 Mg Tabs (Cyclobenzaprine hcl) .... Take 1 to 1.5 tablet by mouth once a day for spasm or for sleep 4)  Multivitamins Tabs (Multiple vitamin) .... Take 1 tablet by mouth once a day 5)  Garlic Powd (Garlic) .... Take 1 tablet by mouth once a day 6)  Fish Oil Oil (Fish oil) .... Once daily 7)  Aleve 220 Mg Tabs (Naproxen sodium) .... Takes 3  tabs daily 8)  Zantac 150 Mg Tabs (Ranitidine hcl) .... Take 1 tablet by mouth once a day 9)  Celexa 40 Mg Tabs (Citalopram hydrobromide) .Marland Kitchen.. 1 by mouth qday 10)  Flonase 50 Mcg/act Susp (Fluticasone propionate) .... 2 sprays per nostril per day  Patient Instructions: 1)  I would try the flonase, 2 sprays in each nostil daily.  See if that helps with the runny nose.  Let me know if it gets worse or if you have other concerns about the cyst on your wrist.  Take care.  Prescriptions: FLONASE 50 MCG/ACT SUSP (FLUTICASONE PROPIONATE) 2 sprays per nostril per day  #1 x 5   Entered and Authorized by:   Crawford Givens MD   Signed by:   Crawford Givens MD on 05/04/2010   Method used:   Electronically to        [facility] #[id number]* (retail)       [address]       [place], [place]  [address]       Ph: [phone]       Fax: [phone]   RxID:   [id number]    Orders Added: 1)  Prescription Created Electronically [  G8553]    Current Allergies (reviewed today): No known allergies

## 2010-05-18 ENCOUNTER — Telehealth: Payer: Self-pay | Admitting: Family Medicine

## 2010-05-27 NOTE — Progress Notes (Signed)
Summary: tramadol   Phone Note Refill Request Message from:  Fax from Pharmacy on May 18, 2010 1:49 PM  Refills Requested: Medication #1:  TRAMADOL HCL 50 MG TABS 2 by mouth four times a day x 30 days   Last Refilled: 04/17/2010  Medication #2:  NORCO 10-325 MG TABS Take 1/2 tablet by mouth twice a day   Last Refilled: 04/17/2010 Refill request from [facility] [address] [phone]  Initial call taken by: Melody Comas,  May 18, 2010 2:24 PM  Follow-up for Phone Call        please call in.  Follow-up by: Crawford Givens MD,  May 18, 2010 3:13 PM  Additional Follow-up for Phone Call Additional follow up Details #1::        Medication phoned to pharmacy.  Additional Follow-up by: Delilah Shan CMA Abby Tucholski Dull),  May 18, 2010 4:33 PM    Prescriptions: NORCO 10-325 MG TABS (HYDROCODONE-ACETAMINOPHEN) Take 1/2 tablet by mouth twice a day  #30 x 0   Entered and Authorized by:   Crawford Givens MD   Signed by:   Crawford Givens MD on 05/18/2010   Method used:   Telephoned to ...       [facility] [phone]* (retail)       [address]       [place], [place]  [address]       Ph: [phone]       Fax: [phone]   RxID:   [id number] TRAMADOL HCL 50 MG TABS (TRAMADOL HCL) 2 by mouth four times a day x 30 days  #240 x 0   Entered and Authorized by:   Crawford Givens MD   Signed by:   Crawford Givens MD on 05/18/2010   Method used:   Telephoned to ...       [facility] #[id number]* (retail)       [address]       [place], [place]  [address]       Ph: [phone]       Fax: [phone]   RxID:   [id number]

## 2010-06-15 ENCOUNTER — Telehealth: Payer: Self-pay | Admitting: Family Medicine

## 2010-06-23 NOTE — Progress Notes (Signed)
Summary: tramadol, norco, flonase   Phone Note Refill Request Message from:  Fax from Pharmacy on June 15, 2010 2:50 PM  Refills Requested: Medication #1:  TRAMADOL HCL 50 MG TABS 2 by mouth four times a day x 30 days  Medication #2:  NORCO 10-325 MG TABS Take 1/2 tablet by mouth twice a day  Medication #3:  FLONASE 50 MCG/ACT SUSP 2 sprays per nostril per day. [facility]ill [facility] from [facility] [address] [phone]  Initial call taken by: Melody Comas,  June 15, 2010 3:04 PM  Follow-up for Phone Call        I sent the flonase and tramadol.  please call in the norco.  thanks.  Follow-up by: Crawford Givens MD,  June 15, 2010 5:22 PM  Additional Follow-up for Phone Call Additional follow up Details #1::        Medication phoned to pharmacy.  Additional Follow-up by: Delilah Shan CMA (AAMA),  June 16, 2010 8:12 AM    Prescriptions: NORCO 10-325 MG TABS (HYDROCODONE-ACETAMINOPHEN) Take 1/2 tablet by mouth twice a day  #30 x 0   Entered and Authorized by:   Crawford Givens MD   Signed by:   Crawford Givens MD on 06/15/2010   Method used:   Telephoned to ...       [facility] #[id number]* (retail)       [address]       [place], [place]  [address]       Ph: [phone]       Fax: [phone]   RxID:   [id number] FLONASE 50 MCG/ACT SUSP (FLUTICASONE PROPIONATE) 2 sprays per nostril per day  #1 x 5   Entered and Authorized by:   Crawford Givens MD   Signed by:   Crawford Givens MD on 06/15/2010   Method used:   Electronically to        [facility] [phone]* (retail)       [address]       [place], [place]  [address]       Ph: [phone]       Fax: [phone]   RxID:   [id number] TRAMADOL HCL 50 MG TABS (TRAMADOL HCL) 2 by mouth four times a day x 30 days  #240 x 0   Entered and Authorized by:   Crawford Givens MD   Signed by:   Crawford Givens MD on 06/15/2010   Method used:    Electronically to        [facility] #[id number]* (retail)       [address]       [place], [place]  [address]       Ph: [phone]       Fax: [phone]   RxID:   [id number]

## 2010-07-16 ENCOUNTER — Other Ambulatory Visit: Payer: Self-pay | Admitting: *Deleted

## 2010-07-16 MED ORDER — HYDROCODONE-ACETAMINOPHEN 10-325 MG PO TABS: ORAL_TABLET | ORAL | Status: DC

## 2010-07-16 MED ORDER — TRAMADOL HCL 50 MG PO TABS: ORAL_TABLET | ORAL | Status: DC

## 2010-07-16 NOTE — Telephone Encounter (Signed)
Medication phoned to pharmacy.  Message on VM for patient to schedule 30 min OV this summer.

## 2010-07-16 NOTE — Telephone Encounter (Signed)
Please call in.  Please have patient schedule for this summer, OV, to check up on his back and pain meds.  Thanks.

## 2010-08-10 ENCOUNTER — Other Ambulatory Visit: Payer: Self-pay | Admitting: *Deleted

## 2010-08-10 MED ORDER — HYDROCODONE-ACETAMINOPHEN 10-325 MG PO TABS: ORAL_TABLET | ORAL | Status: DC

## 2010-08-10 MED ORDER — TRAMADOL HCL 50 MG PO TABS: ORAL_TABLET | ORAL | Status: DC

## 2010-08-10 NOTE — Telephone Encounter (Signed)
Medication phoned to pharmacy.  

## 2010-08-10 NOTE — Telephone Encounter (Signed)
Please call these into the pharmacy in [place].  Thanks.

## 2010-08-10 NOTE — Telephone Encounter (Signed)
Patient says that he has gone to [place] for a couple of days and left his meds at home. He is asking if he could get a couple days supply sent to [facility] in [place]. Please advise.

## 2010-08-18 NOTE — Assessment & Plan Note (Signed)
[name] returns today.  He currently has an increased pain in his back  particularly with activity.  He has been playing some golf, and he has  been doing lot of stretching.  He does walking.  He can walk 45 minutes  at a time, which is stable compared to last visit.  He is employed 15  hours a week.  His average pain is in the 3-4 range, which is up from  the 1-2 range before.  Pain is mainly in the low back, really little in  terms of radiating pain.   SOCIAL HISTORY:  Married, he is expecting his first child within the  next month.   FAMILY HISTORY:  Heart disease and high blood pressure.   Oswestry disability score performed today 34%, moderate range.   PHYSICAL EXAMINATION:  VITAL SIGNS:  Blood pressure 135/64, pulse 83,  respiratory rate is 18, and O2 sat 99% on room air.  GENERAL:  Well-developed male in no acute distress.  Orientation x3.  Affect is alert.  Gait is normal.  He is able to toe walk and heel walk.  EXTREMITIES:  Without edema.  Normal coordination in upper and lower  extremities.  Deep tendon reflexes normal.  Sensation is normal.  Strength is normal in the lower extremities.  Lumbar spine range of  motion is good in forward flexion, however extension does cause some  pain, he had 75% range of extension.   IMPRESSION:  Increasing lumbar spine.  He is approximately 11 months'  post epidural injection.  I believe this is wearing off.  I did caudal  approach last year.  We will repeat rather than up his medication.   Discussed with the patient, who understands rationale.  We will schedule  him in the next couple of weeks.      Erick Colace, M.D.  Electronically Signed     AEK/MedQ  D:  01/25/2008 10:16:30  T:  01/25/2008 23:21:05  Job #:  [id number]

## 2010-08-18 NOTE — Assessment & Plan Note (Signed)
[name] returns today.  He is a 35 year old male, status post 2-level  fusion.  He was last seen by me on March 10, 2007.  His last epidural  was done on February 09, 2007.   He has been doing quite well just taking:  1. Norco 10/325 one in the morning, one half in the afternoon, and one      half in the evening.  2. He takes tramadol 2 tablets t.i.d.  3. And, two Aleve per day.   He has been taking some specialized physical therapy at [facility] with  a therapist who specializes in golf rehabilitation.  He is back to  walking 9 holes, playing golf twice a week.  He is employed 35 hours a  week.  He has a pain averaging 3/10, currently 5/10.  It interferes with  activity at a mild to moderate level.  The pain is worse with bending,  sitting, inactivity.  It improves with rest and medications.  He walks  45 minutes at a time.  He climbs steps and he drives.   SOCIAL HISTORY:  Married, working as noted above.   PHYSICAL EXAMINATION:  VITAL SIGNS:  His blood pressure is 111/55, pulse  85, respirations 18, O2 sat 97% on room air.  BACK:  No tenderness to palpation.  He is able to forward flex about 75%  of normal range, mainly at the hips.  He can bend backwards and gets  about 75% range with that.  MUSCULOSKELETAL:  Lower extremity strength is 5/5 in the hip flexor,  knee extensor, ankle dorsiflexion and plantar flexion.  Deep tendon  reflexes are normal.  He has decreased internal rotation of the hips  bilaterally.  He has negative fabere maneuver.  His gait is normal.  He  is able to heel walk, toe walk.   IMPRESSION:  Lumbar post laminectomy syndrome, status post fusion L4-5,  L5-S1 levels.   No evidence of lower extremity radicular pain.   PLAN:  1. Continue Norco 10/325, he takes a total of 60 tablets per month.  I      have asked him to try to cut down to just 1/2 tablet t.i.d.  2. Continue Tramadol 2 p.o. t.i.d.  3. Continue Aleve 2 tablets per day.  4. I will see him  back in 3 months.      Erick Colace, M.D.  Electronically Signed     AEK/MedQ  D:  05/08/2007 10:17:38  T:  05/08/2007 12:09:01  Job #:  [id number]

## 2010-08-18 NOTE — Assessment & Plan Note (Signed)
[name] returns today.  I did a caudal epidural steroid injection  under fluoroscopic guidance March 07, 2008.  He did well with this,  perhaps not quite as well as he did with prior injection although, but  is functioning well, is working out.  He is golfing, weather permitting.  He has had no other medical complications.  He is having problems with  sleep and after further questioning, turns out that he drinks quite a  bit of caffeinated soda, up until 5:00 p.m..  I did indicate that  caffeine will be in his system for approximately 12 hours.   His average pain is 4/10 to 5/10 range.   CURRENT MEDICATIONS:  1. Hydrocodone 10/325 one-half p.o. q.i.d.  He actually had been      taking t.i.d. more and has been leaving out the nighttime dose      because his pain does not interfere with sleep much.  2. His tramadol dosage is 2 p.o. t.i.d.  3. His pain is worse with bending, sitting.  Relief from meds is good.   SOCIAL HISTORY:  Recent birth of daughter, 2-month-old now, is also  contributing to his sleep disturbance.   Over-the-counter medications include Aleve 2 p.o. daily.   Oswestry disability score 36%, which compares to 38% last visit.  No  significant change.   PHYSICAL EXAM:  VITAL SIGNS:  Blood pressure 121/71, pulse 77,  respirations 18, O2 sat 98% on room air.  GENERAL:  In no acute stress.  Orientation x3.  Affect is alert.  Gait  is normal and lumbar spine range of motion is full.  His lower extremity  strength is normal.  Deep tendon reflexes are normal.   IMPRESSION:  1. Lumbar post-laminectomy syndrome.  2. Lumbar degenerative disk.   PLAN:  Continue current treatment plan.  Counseled lifestyle  modification in terms of his caffeine intake.  I will see him back in  about 3 months.  No repeat epidural needed at this time.  Should we  decide to repeat, would consider going back to a translaminar approach  L3-4.      Erick Colace, M.D.  Electronically Signed     AEK/MedQ  D:  05/03/2008 16:03:30  T:  05/04/2008 07:40:53  Job #:  [id number]

## 2010-08-18 NOTE — Procedures (Signed)
NAME:  [name], [name]                 ACCOUNT NO.:  [id number]   MEDICAL RECORD NO.:  [id number]          PATIENT TYPE:  REC   LOCATION:  [facility]                          FACILITY:  [facility]   PHYSICIAN:  Celene Kras, MD        DATE OF BIRTH:  21-Feb-1976   DATE OF PROCEDURE:  DATE OF DISCHARGE:                               OPERATIVE REPORT   [name].  I evaluate  him.  I review health and history form and 14-point review of systems.   1. [name] comes to [place] today complaining of paralumbar discomfort and      functional impairment secondary to pain.  We are going to go on to      lumbar epidural and I have reviewed risks, complications and      options.  2. I plan L3-4.  His symptoms seem to be L3 and L4 overlay today,      nothing new neurologically.  3. Predicated further intervention based on need.  4. Consider caudal approach, consider adhesiolysis.  He is pretty      active.  He is now running for [employer]; he is very focused,      going back to school, likely very strong outcome will be obtained.   OBJECTIVE:  Diffuse paralumbar fascial discomfort, Wharton test positive  with side-bending, pain with extension.  Nothing new neurologically.  Typical pain, mechanical diskogenic.   IMPRESSION:  Degenerative spinal disease of lumbar spine.   PLAN:  Lumbar epidural.  He is consented.   DESCRIPTION OF PROCEDURE:  The patient was taken to the fluoroscopy  suite and placed in the prone position and the back prepped and draped  in the usual fashion.  Using a Husted needle, I advanced to the L3-4  interspace without any evidence of CSF, heme or paresthesias.  Test  block uneventfully followed by 40 mg of Aristocort and 2 mL of lidocaine  and 1% MPF and flushed the needle.   He tolerated the procedure well.  No complications from our procedure.  Appropriate recovery.  Discharge instructions given.           ______________________________  Celene Kras, MD     HH/MEDQ  D:  12/20/2006 08:59:01  T:  12/20/2006 11:37:30  Job:  [id number]

## 2010-08-18 NOTE — Assessment & Plan Note (Signed)
History of lumbar post laminectomy syndrome, history of fusion at L4-5  and L5-S1 levels.  His radicular pain is well-controlled.  He has a new  pain in the left side of the low back and upper aspect of the incision  site.  Pain is worse with forward bending and leaning toward the right  side.  He is able to golf.  He does aggravate as a bit.  He walks 60  minutes at a time.  He is last employed in 2008 and helps care for his 6-  month-old daughter, wife works.  Oswestry score is 38%.   PHYSICAL EXAMINATION:  VITAL SIGNS:  Blood pressure 130/77, pulse 80,  respirations 18, and O2 sat 99% on room air.  GENERAL:  No acute distress.  Orientation x3.  Affect is alert.  Gait is  normal.  BACK:  He has no tenderness to palpation.  Lumbar spine range of motion  is normal.  He does have pain with forward flexion and bending toward  the right as well as end range twisting toward the right.  Lower  extremity strength is normal.  Deep tendon reflexes are normal.  Sensation is normal.  Strength is normal.   IMPRESSION:  Lumbar post-laminectomy syndrome.  I believe he is either  having some adjacent level facet or disk degeneration.  Alternatively,  this could be a deep muscle spasm, perhaps in the multifidi muscles and  some of the other small rotatory muscles in the back.  Certainly, it  would be deep since I cannot palpate.   We discussed treatment options including just waiting for another month  and starting some increased nonsteroidal such as Aleve 2 p.o. b.i.d. for  a month and if no better consider flexion/extension views, also consider  facet injections and another bout of physical therapy.      [name], M.D.  Electronically Signed     AEK/MedQ  D:  08/05/2008 16:45:50  T:  08/06/2008 06:34:04  Job #:  [id number]

## 2010-08-18 NOTE — Assessment & Plan Note (Signed)
[name] returns today.  I last saw him on May 08, 2007.  He did a  no show on July 31, 2007 but rescheduled.  He is a 35 year old male,  status post 2-level fusion per Dr. Georgia Duff.  His last epidural was  done on February 09, 2007.   MEDICATIONS:  1. Norco 10/325 one half tablet p.o. q.i.d.  2. Tramadol 50 mg 2 tablets p.o. t.i.d.  3. Aleve 2 per day.   He has been back to golf.  He has pain averaging about 4 out of 10,  increases with activity to about 5 out of 10.  The pain is worse with  bending and inactivity.  When he plays golf, putting seems to actually  do the worst for him in terms of golf activity.   SOCIAL HISTORY:  Married.   PAST MEDICAL HISTORY:  Significant for a mildly elevated cholesterol  level using dietary means to control following up with his primary on  this.   PHYSICAL EXAMINATION:  VITAL SIGNS:  His blood pressure is 118/58, pulse  90, respirations 18, O2 sat 100% on room air.  GENERAL:  No acute distress.  Mood and affect appropriate.  BACK:  No tenderness to palpation.  His forward flexion is full.  Extension is 75%.  MUSCULOSKELETAL:  His lower extremity strength is normal.  Deep tendon  reflexes are normal.  He has decreased internal/external rotation of the  hips, particularly of internal rotation.  I did show him the hurdler's  stretch which he can use to help with this.   IMPRESSION:  1. Lumbar post laminectomy syndrome.  2. Lumbar disk disorder.  3. Hip contracture.   PLAN:  1. Continue current medications which include:      a.     Norco 10/325 one half tablet q.i.d., #60 for a month.      b.     Tramadol 50 mg two p.o. t.i.d.  2. I asked him to see if he could reduce his Aleve one or day or just      take it as needed.  3. If he is doing well next visit, may consider further reduction of      hydrocodone dosage to 7.5.  4. I will see him back in about 3 months.  5. No epidural needed at this time.      Erick Colace,  M.D.  Electronically Signed    AEK/MedQ  D:  08/10/2007 15:29:09  T:  08/10/2007 15:56:43  Job #:  [id number]   cc:   Georgia Duff, Dr.

## 2010-08-18 NOTE — Procedures (Signed)
NAME:  [name], [name]                 ACCOUNT NO.:  [id number]   MEDICAL RECORD NO.:  [id number]           PATIENT TYPE:   LOCATION:                                 FACILITY:   PHYSICIAN:  Erick Colace, M.D.DATE OF BIRTH:  September 07, 1975   DATE OF PROCEDURE:  DATE OF DISCHARGE:                               OPERATIVE REPORT   This is caudal epidural steroid injection under fluoroscopic guidance.   INDICATION:  Lumbar post laminectomy syndrome.   Informed consent was obtained after describing risks and benefits of the  procedure with the patient.  These include bleeding, bruising,  infection.  The benefits include allowing him not to escalate on his  medications and to sleep better at night which has been a problem for  him.   Pain level is at 5-6/10 range.   The patient was placed prone on fluoroscopy table.  Betadine prep,  sterile drape.  Area marked and prepped with Betadine, entered with 22-  gauge 3-1/2-inch spinal needle inserted under fluoroscopic guidance.  AP  and lateral images utilized.  Omnipaque 180 under live fluoro x3 mL  demonstrated epidural spread followed by injection of 2 mL of 40 mg/mL  Depo-Medrol and 2 mL of 1% MPF lidocaine.  The patient tolerated the  procedure well.  Pre and postinjection vitals stable.  Postinjection  instructions given.   Should he not get adequate response from this, we would go with  bilateral S1 injections.      Erick Colace, M.D.  Electronically Signed     AEK/MEDQ  D:  03/07/2008 11:12:00  T:  03/08/2008 00:34:08  Job:  [id number]

## 2010-08-18 NOTE — Assessment & Plan Note (Signed)
[facility], evaluated  and __________  14 point review of systems.  I have not seen him since  his fusion, and with his permission after examination, the case manager,  Tonie Griffith, comes to the room.  As we catch up, he has had a fusion,  has done very well in convalescence, brought down from 12 Percocet's a  day to 1/2 to 1 a day, he started at [facility], and he is doing well with  tramadol as primary pain control.  He is referred back to [place] for  management of medications.   As I relay to him, he is still a bit early in his convalescence so we  will continue him on Percocet as his function is so high, follow up in a  month or two.  He is to start decreasing his pain medication, half to 1,  try to go every other day over time.  He does need it, legitimate need  is met, but tramadol and non narcotic options will probably be  emphasized.   He is describing some mood changes and maybe some decreased sexual  drive, and I am going to get a testosterone level.  He will follow up  with primary care.  He agrees to this.   I do not think interventions are warranted at this time.  We will follow  him along expectantly.   See him in about 2 months, and I think as I mentioned before, tincture  of time will be our best friend.   OBJECTIVE:  Diffuse paralumbar fascia __________  pain with extension.  Nothing new neurologically.   IMPRESSION:  Degenerative spinal disease of lumbar spine status post  fusion.   PLAN:  Pain management, follow up in 2 months, consider muscle  stimulator.  Discharge instructions given.           ______________________________  Celene Kras, MD     HH/MedQ  D:  08/16/2006 11:40:18  T:  08/16/2006 12:08:33  Job #:  [id number]

## 2010-08-18 NOTE — Assessment & Plan Note (Signed)
HISTORY OF PRESENT ILLNESS:  Mr. Woo returns today. He has been using  narcotic analgesic medications to help with his pain control. 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 He has a prior history of lumbar fusion at L4-5, L5-S1 levels.  Has had about 6 week relief at L3-4 epidural's.   MEDICATIONS:  Hydrocodone N/500 1 p.o. b.i.d., Tramadol 1 to 2 p.o.  t.i.d. and he has 100 per month.   PHYSICAL EXAMINATION:  NEUROLOGIC:  He has tenderness to palpation in  the lumbar paraspinal's. No pain over the coccygeal area. His gait is  without toe drag or knee instability.   IMPRESSION:  1. Lumbar post-laminectomy syndrome.  2. Chronic back pain, interfering with activity.   PLAN:  With pain management combination of spinal injection, non-  narcotic and narcotic analgesics, the patient is maintaining function.      Erick Colace, M.D.  Electronically Signed     AEK/MedQ  D:  02/09/2007 12:33:43  T:  02/09/2007 16:15:41  Job #:  [id number]

## 2010-08-18 NOTE — Assessment & Plan Note (Signed)
[name] returns today.  He has been doing quite well since I last saw  him 3 months ago.  He has been able to go back to golf.  He is having  pain averaging in the 1-2/10 range, interfering with activity at 3/10  range but this is with medication.  He takes 1/2 of the Norco 325 up to  q.i.d. 60 tablets for a month, tramadol 2 p.o. t.i.d.   He is having some pain above the level of the fusion mainly with  twisting.   His pain is described as aching.  Worse with bending and sitting.  Relief from meds is good.  He can walk 45 minutes at a time.  He climbs  steps.  He drives.  Works with [employer].   PHYSICAL EXAMINATION:  VITAL SIGNS:  Blood pressure 131/63, pulse 72,  respiration 20, and O2 sat 98% on room air.  GENERAL:  No acute distress.  BACK:  No tenderness to palpation in the lumbar paraspinal.  He does  have some pain above the level of fusion with twisting at end range but  less so with forward flexion or extension.  He has normal strength,  normal reflexes, and normal sensation in bilateral lower extremities.  Normal muscle bulk.   IMPRESSION:  1. Lumbar postlaminectomy syndrome.  He may be developing some      discogenic pain above the level of fusion versus cytogenic.  In      either case, it is relatively mild and occurs at end range      rotation.  I have asked him to reduce some of the stretching      exercises that he does in that direction and also to be careful      when he golfs, not to use excessive rotation of the upper body.  2. Continue current medications which include Norco 10/325 one-half      p.o. q.i.d. as well as tramadol 50 mg 2 p.o. t.i.d.      [name], M.D.  Electronically Signed     AEK/MedQ  D:  11/02/2007 08:57:21  T:  11/02/2007 23:41:39  Job #:  [id number]

## 2010-08-18 NOTE — Procedures (Signed)
NAME:  [name], [name]                 ACCOUNT NO.:  [id number]   MEDICAL RECORD NO.:  [id number]           PATIENT TYPE:   LOCATION:                                 FACILITY:   PHYSICIAN:  Erick Colace, M.D.DATE OF BIRTH:  September 29, 1975   DATE OF PROCEDURE:  DATE OF DISCHARGE:                               OPERATIVE REPORT   HISTORY:  Lumbar post laminectomy syndrome, L4-5, L5-S1 fusion,  radicular pain well controlled.  Main complaint is fatigue, loss of  energy, loss of libido.   His current pain medications; hydrocodone 5 mg q.i.d. and tramadol 2  p.o. t.i.d.  He tried Amrix at night which helped with sleep, however  too extensive.   REVIEW OF SYSTEMS:  Positive depression and weakness.  He has been  working out quite a bit which does not seem to be an issue.   Pain level is 4/10.  Sleep is poor, but improved after Flexeril.   Back has no tenderness to palpation.  Lumbar spine range of motion is  normal, normal strength.  Deep tendon reflexes and sensation to the  lower extremities normal range of motion with the exception of internal  rotation to right hip.   IMPRESSION:  Lumbar post laminectomy syndrome.   PLAN:  We will try checking testosterone level. We will reduce  hydrocodone to b.i.d., increase tramadol to q.i.d., cyclobenzaprine 10  mg at night.  I will see him back in 1-2 months.      Erick Colace, M.D.  Electronically Signed     AEK/MEDQ  D:  09/17/2008 11:22:37  T:  09/18/2008 01:47:17  Job:  [id number]

## 2010-08-18 NOTE — Assessment & Plan Note (Signed)
[name] returns today.  He is a 35 year old male who is status post L4-  5 and L5-S1 fusion.  He has had no significant radicular pain.  He has  had some chronic back pain.  His main complaint is activity-related  pain.  He had complaints of fatigue and less libido, however,  testosterone level was normal.  His average pain is 3-4/10 range.  Average activity related pain is 4/10.  He does continue to golf.  He  does quite a bit of exercise in terms of stretching and strengthening of  his core muscle.  He walks 60 minutes at a time.   His Oswestry index score is 34%.   PHYSICAL EXAMINATION:  VITAL SIGNS:  Blood pressure 117/68, pulse 86,  respirations 18, and O2 sat 100% on room air.  GENERAL:  Well-developed, well-nourished male in no acute stress.  Orientation x3.  Affect is alert.  Gait is normal.  BACK:  No tenderness to palpation.  Lumbar spine range of motion is  normal and forward flexion with 50% extension.  EXTREMITIES:  Lower extremity strength is normal.  Deep tendon reflexes  are normal.   IMPRESSION:  Lumbar postlaminectomy syndrome.  Chronic postoperative  pain.  No signs of radicular component.   PLAN:  We will continue current medications, which are:  1. Cyclobenzaprine 10 mg nightly.  2. Tramadol 50 mg 2 p.o. q.i.d.  3. Hydrocodone 1/2 tablet b.i.d. only 10/325 mg dosage.      Erick Colace, M.D.  Electronically Signed     AEK/MedQ  D:  11/19/2008 09:40:57  T:  11/19/2008 11:09:54  Job #:  [id number]

## 2010-08-18 NOTE — Procedures (Signed)
NAME:  [name], [name]                 ACCOUNT NO.:  [id number]   MEDICAL RECORD NO.:  [id number]          PATIENT TYPE:  REC   LOCATION:  [facility]                          FACILITY:  [facility]   PHYSICIAN:  Erick Colace, M.D.DATE OF BIRTH:  [birth year]   DATE OF PROCEDURE:  02/09/2007  DATE OF DISCHARGE:                               OPERATIVE REPORT   PROCEDURE PERFORMED:  Caudal epidural steroid injection under  fluoroscopic guidance.   INDICATIONS:  Lumbar post laminectomy syndrome.   The patient has had approximately six weeks relief from epidural steroid  injection at the L3-L4 level with gradual return, still using narcotic  analgesics for pain management.  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  Pain limits  activities.   Informed consent was obtained after describing risks and benefits of the  procedure to the patient including bleeding, bruising, infection.  He  elects to proceed and has given written consent. The patient was placed  prone on the fluoroscopy table.  Betadine prep, sterile drape.  A 25  gauge 1 1/2 inch needle was used to anesthetize the skin and subcu. The  caudal epidural space was entered and this was confirmed with AP lateral  imaging with a 25-gauge needle.  Contrast x 1 mL was utilized and  appropriate epidural pattern obtained after repositioning the needle  followed by injection of a solution containing 2 mL of 40 mL per mL Depo-  Medrol and 2 mL of 1% MPF lidocaine.  The patient tolerated the  procedure well.  Pre and post injection vitals stable.  Post injection  instructions given.      Erick Colace, M.D.  Electronically Signed     AEK/MEDQ  D:  02/09/2007 12:29:57  T:  02/09/2007 20:56:32  Job:  [id number]   cc:   Jonne Ply, M.D.

## 2010-08-18 NOTE — Assessment & Plan Note (Signed)
[name] __________  14 point review of systems.   1. Pretty stable presentation, we are pleased that he is in training      to work with computers, and very functional on Ultram, occasional      hydrocodone for breakthrough. We will steady the course for the      next month or 2. I imagine he is having some added biomechanical      stress above and below the surgical fixation site, I would consider      a lumbar epidural in a month if necessary. The rational is to move      away from controlled substances if possible.  2. Other modifiable features and __________ profile reviewed.  3. I will follow him along expectantly. Nothing new neurologically,      did not believe further imaging or diagnostics were warranted,      probably the patient to our physiatry colleagues.   Objectively improved, improved range of motion __________ myofascial  comfort range of motion unimpaired.__________ test positive. Nothing new  neurologically.   1. Mechanical discogenic pain.   IMPRESSION:  __________ lumbar spine.   PLAN:  Conservative management. Discharge instructions given. Follow up  in a month and then possibly p.r.n. or intervention if necessary.           ______________________________  Celene Kras, MD     HH/MedQ  D:  10/18/2006 09:41:53  T:  10/18/2006 14:46:19  Job #:  [id number]

## 2010-08-18 NOTE — Assessment & Plan Note (Signed)
[name] returns today.  He was last seen by me February 09, 2007.  He  had a caudal epidural steroid injection under fluoroscopic guidance for  lumbar postlaminectomy syndrome.  It did help for about 3 weeks, but he  got about 6 weeks' relief at the L3-4 level, some gradual return.  He  has no new medical problems in the interval time.  He has remained quite  active and has been doing his therapy exercises.  His goal is to get  back to golf.  His average pain is 4/10 to 5/10.  His sleep is fair.  He  is employed 35 hours a week.  He is taking classes as well.   Blood pressure is 116/69, pulse 90, respirations 18, O2 saturation 98%  on room air.  GENERAL:  In no acute distress.  Mood and affect appropriate.  His back has no tenderness to palpation.  He is able to forward flex.  A  lot of it comes from the hips.  He is able to twist without difficulty  at all.  He can bend backwards without limitation in range of motion  except at end range.   His lower extremity strength is normal.  Deep tendon reflexes are  normal.  He does have decreased internal rotation at the hips, left  greater than right.   IMPRESSION:  Lumbar postlaminectomy syndrome status post fusion L4-5, L5-  S1 levels.  His lower extremity radicular pain is relatively well  controlled at present.   PLAN:  1. Continue Norco 10/325 one p.o. b.i.d.  2. Continue tramadol 1 to 2 tablets p.o. t.i.d. p.r.n.  3. Continue Aleve.  He takes this 1 tablet a day.   I will see him back in 2 months and see if he needs repeat epidural at  that time.      Erick Colace, M.D.  Electronically Signed     AEK/MedQ  D:  03/10/2007 13:43:45  T:  03/10/2007 16:27:38  Job #:  [id number]

## 2010-08-18 NOTE — Procedures (Signed)
NAME:  [name], [name]                 ACCOUNT NO.:  [id number]   MEDICAL RECORD NO.:  [id number]          PATIENT TYPE:  REC   LOCATION:  [facility]                          FACILITY:  [facility]   PHYSICIAN:  Erick Colace, M.D.DATE OF BIRTH:  04-Mar-1976   DATE OF PROCEDURE:  01/03/2007  DATE OF DISCHARGE:                               OPERATIVE REPORT   This is lumbar epidural steroid injection under fluoroscopic guidance.   INDICATIONS:  Lumbar disk with radicular symptomatology.  He had  positive response to epidural steroid injection per Dr. Stevphen Rochester December 20, 2006 but feels like this is a wearing off now.  This is his second  set.   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   Informed consent was obtained after describing risks and benefits to the  patient.  These include bleeding, bruising, infection, loss of bowel  bladder function, temporary or permanent paralysis.  He elects to  proceed and has given written consent.  The patient placed prone on  fluoroscopy table.  Betadine prep, sterile drape, 25-gauge 1-1/2 inch  needle was used to anesthetize skin and subcu tissue 1% lidocaine x2 mL,  an 18 gauge Husted needle was inserted under fluoroscopic guidance  targeting the inferior lamina of L3, bone contact made and needle was  redirected inferiorly and then loss of resistance technique was employed  and obtained followed by Omnipaque 180 x 1 mL showing good epidural  spread and injection of 2 mL of 40 mL per mL Depo-Medrol and 2 mL 1% MPF  lidocaine.  The patient tolerated the procedure well.  Pre injection  level 5/10, post injection 1-2/10.      Erick Colace, M.D.  Electronically Signed     AEK/MEDQ  D:  01/03/2007 17:49:27  T:  01/04/2007 07:57:47  Job:  [id number]

## 2010-08-19 ENCOUNTER — Other Ambulatory Visit: Payer: Self-pay | Admitting: *Deleted

## 2010-08-19 MED ORDER — TRAMADOL HCL 50 MG PO TABS: ORAL_TABLET | ORAL | Status: DC

## 2010-08-19 NOTE — Telephone Encounter (Signed)
This was filled on 08-10-10, but only for a quantity of 40 and patient take 2 tabs by mouth four times daily.

## 2010-08-19 NOTE — Telephone Encounter (Signed)
Sent in.  Notify patient if needed. 

## 2010-08-21 NOTE — Assessment & Plan Note (Signed)
[name] is a very pleasant individual who comes to [place], a 35 year old with  low back pain, decline in functional indices and quality of life indices,  describing paralumbar discomfort with left intragluteal and modest radicular  pain to the left side.  This is a work-related incident, followed by Dr.  Venetia Maxon, with functional impairment.  He has had a functional capacity exam,  not cleared to return to [employer] which requires a 70-pound weight lifting  capacity.  He has been limited, and is looking at vocational retraining.  Pain control is the primary issue; he is slowly escalating on controlled  substances which he wants to avoid.  He is thinking about going into  computers and would like to retain full cognitive capacity.  His pain is a  7/10 on subjective scale, dull aching and throbbing, made worse by bending,  improved with rest and medication.  He has difficulty with endurance,  climbing stairs, but is able to drive.  He is staying home at this time.  Associated sometimes with numbness, tingling, and related to recent episode  of spasm in his low back, currently being treated by physical therapy and  massage therapy.   MEDICATIONS:  Attached to chart.  About 2-3 Vicodin a day at 5 mg strength,  starting to escalate to 4 times a day, and he is on 75 mg Lyrica b.i.d. and  Flexeril, which he states is ineffective.   ALLERGIES:  Attached to chart.  He states no known drug allergies.   REVIEW OF SYSTEMS:  A 14-point Review of Systems in Health and History form  are reviewed.   PAST MEDICAL AND SURGICAL HISTORY:  Noncontributory and benign.  He states  he is otherwise healthy and is followed by primary care.  He has seen  neurology; he is felt at this time to be nonsurgical.  He is a nonsmoker,  nondrinker.   FAMILY HISTORY:  Benign.   Review of Systems, Family and Social History are otherwise noncontributory  to the pain problems.   PHYSICAL EXAMINATION:  GENERAL: Pleasant male  sitting comfortably in bed.  Gait, affect, appearance are normal.  Alert and oriented x3.  HEENT:  Unremarkable.  CHEST: Clear to auscultation and percussion.  HEART: Regular rate and rhythm without rub, murmur, or gallop.  ABDOMEN:  Soft, nontender, benign.  No hepatosplenomegaly.  MUSCULOSKELETAL: He has diffuse paralumbar myofascial discomfort.  Pain over  PSI is notable, pain on extension.  Side bending positive. Straight leg  raise unimpaired.  EHL was fine.  NEUROLOGIC: Intact.   IMPRESSION:  Degenerative disease lumbar spine with radiculopathy, facet  overlay.   PLAN:  1. From a broad brush stroke, it is reasonable to go onto lumbar epidural.      We will then probably address the facet with positive radicular      experience with consideration for radiofrequency neural ablation.  2. I am not really sure what a surgeon would offer at this time, but we      may need surgical input.  I reviewed the MRI report attached to the      chart, and I suspect he would be considered a fusion candidate.  3. He wants to avoid surgery if possible and desires conservative      management.  4. Opioid consent and patient care agreement.  I am going to go ahead and      go on up to 10 mg strength hydrocodone at 1/2 to 1 tablet up to 3  times      a day, and then increase Lyrica to 3 times a day in the short term      during the injection cycling, and then possibly go to h.s. to retain      cognitive capacity and switch to Tramadol over time.  We want to enable      him, and I anticipate he will do fine over time.  I reassured him; he      is concerned      about progressive levels of disability, and I think tincture of time      will probably be our best friend.  Discharge instructions given.      Consider muscle stimulator.           ______________________________  Celene Kras, MD     HH/MedQ  D:  11/16/2005 13:48:38  T:  11/16/2005 17:14:21  Job #:  [id number]   cc:   Danae Orleans. Venetia Maxon,  M.D.  Fax: [phone]   Tonie Griffith, RNCCM

## 2010-08-21 NOTE — Discharge Summary (Signed)
NAME:  [name], [name]                           ACCOUNT NO.:  [id number]   MEDICAL RECORD NO.:  [id number]                   PATIENT TYPE:  IPS   LOCATION:  0508                                 FACILITY:  [facility]   PHYSICIAN:  Geoffery Lyons, M.D.                   DATE OF BIRTH:  [birth year]   DATE OF ADMISSION:  07/15/2003  DATE OF DISCHARGE:  07/18/2003                                 DISCHARGE SUMMARY   CHIEF COMPLAINT AND PRESENT ILLNESS:  This was the first admission to [facility] for this 35 year old married white male  voluntarily admitted.  He had a history of alcohol abuse, drinking a six  pack on a weekday and drinking a 12 pack on Friday and 20 beers on the  weekend.  Drinking has been escalating for the last three years.  Seeking  help.  He tried to stop on his own but found he was having some withdrawal  symptoms where his heart was racing.  He stated that he drank to get drunk,  felt very anxious, denied any depressive symptoms.  Sleep had been  decreased.  His appetite had been satisfactory.   PAST PSYCHIATRIC HISTORY:  This was the first time at [facility].  No history of being detoxified before.  Longest sobriety has been  five days in the last three years.   SUBSTANCE ABUSE HISTORY:  His first drink was at age 88.  He never drinks at  work, no early morning drinking.   PAST MEDICAL HISTORY:  Hypercholesterolemia.   MEDICATIONS:  None.   PHYSICAL EXAMINATION:  Physical examination was performed, failed to show  any acute findings.   LABORATORY DATA:  Blood chemistries: Glucose 105.  Liver profile was within  normal limits.  TSH 2.925.  White blood cells 3.8.   MENTAL STATUS EXAM:  Mental status exam revealed an alert __________ male,  cooperative, fair eye contact.  Speech was clear.  He felt anxious, looked  somewhat anxious.  Thought processes were coherent.  There was no evidence  of psychosis, no evidence of delusions or  hallucinations.  Cognitive:  Cognition was well preserved.   ADMISSION DIAGNOSES:   AXIS I:  1. Alcohol dependence.  2. Alcohol withdrawal.  3. Anxiety disorder, not otherwise specified versus alcohol-induced anxiety.   AXIS II:  No diagnosis.   AXIS III:  Hypercholesterolemia.   AXIS IV:  Moderate.   AXIS V:  Global assessment of functioning upon admission 35, highest global  assessment of functioning in the last year 65-70.   HOSPITAL COURSE:  was admitted and started in intensive individual and group  psychotherapy.  He was given Ambien for sleep.  He was detoxified with  Librium.  He was started on Lexapro 5 mg per day.  He evidenced insight in  terms of how he was having a hard time with  alcohol and becoming dependent  on it.  He experienced tremors, insomnia, some sweats and thus he could not  endorse depression other than associated with alcohol use.  Detoxification  continued to go uneventfully.  By April 14, he was more stable and there was  no active withdrawal.  There were no suicidal or homicidal ideations.  He  was wanting to pursue further outpatient treatment.  Discussed the  pharmacological options and he was willing to take the Antabuse.  There was  a family session with the wife; she was supportive.  So we went ahead and  discharged to outpatient followup.   DISCHARGE DIAGNOSES:   AXIS I:  1. Alcohol withdrawal.  2. Alcohol dependence.  3. Anxiety disorder, not otherwise specified.   AXIS II:  No diagnosis.   AXIS III:  Hypercholesterolemia.   AXIS IV:  Moderate.   AXIS V:  Global assessment of functioning upon discharge 60.   DISCHARGE MEDICATIONS:  1. Antabuse 250 mg one daily.  2. ReVia 25 mg daily.  3. Lexapro 10 mg daily.  4. Ambien 10 mg at bedtime for sleep.   FOLLOW UP:  He was to follow up at the [facility].                                               Geoffery Lyons, M.D.    IL/MEDQ  D:  08/06/2003  T:  08/06/2003  Job:   [id number]

## 2010-08-21 NOTE — Discharge Summary (Signed)
NAMEHandsome, Anglin Pope                 ACCOUNT NO.:  [id number]   MEDICAL RECORD NO.:  [id number]          PATIENT TYPE:  INP   LOCATION:  3007                         FACILITY:  [facility]   PHYSICIAN:  Danae Orleans. Venetia Maxon, M.D.  DATE OF BIRTH:  [birth year]   DATE OF ADMISSION:  02/15/2006  DATE OF DISCHARGE:  02/18/2006                               DISCHARGE SUMMARY   REASON FOR ADMISSION:  Lumbar disk degeneration, hyperplasia, lumbar  spondylosis, herniated lumbar disk, low back pain, lumbar radiculopathy.   ADDITIONAL DIAGNOSES:  1. Hyperglycemia.  2. Diaphragmatic hernia.  3. Esophageal reflux.  4. Cardiac dysrhythmias.   Marland KitchenFINAL DIAGNOSES:  Same as listed above.   HISTORY OF ILLNESS AND HOSPITAL COURSE:  [name] is a 35 year old  man with a work related injury to his low back with herniated disks at  the L4-5 and L5-S1 levels.  The patient has undergone extensive  conservative management without any substantial relief of his pain and  was therefore elected to admit him to the hospital for lumbar  decompression and fusion.  He underwent an uncomplicated decompressive  lumbar decompression and fusion operation on February 15, 2006, and did  well with this surgery, was gradually mobilized with physical therapy  and was discharged home on the November 16 in stable and satisfactory  condition having  tolerated his operation and hospitalization well.   DISCHARGE MEDICATIONS:  1. Percocet.  2. OxyIR.  3. Robaxin.   FOLLOWUP:  Follow-up at 3 weeks in the office.      Danae Orleans. Venetia Maxon, M.D.  Electronically Signed     JDS/MEDQ  D:  04/15/2006  T:  04/15/2006  Job:  [id number]

## 2010-08-21 NOTE — Op Note (Signed)
[name], [name]                 ACCOUNT NO.:  [id number]   MEDICAL RECORD NO.:  [id number]          PATIENT TYPE:  INP   LOCATION:  3172                         FACILITY:  [facility]   PHYSICIAN:  Danae Orleans. Venetia Maxon, M.D.  DATE OF BIRTH:  11/23/1975   DATE OF PROCEDURE:  02/15/2006  DATE OF DISCHARGE:                                 OPERATIVE REPORT   PREOPERATIVE DIAGNOSIS:  L4-5 and L5-S1 herniated lumbar disk with  spondylosis, degenerative disks disease and radiculopathy.   POSTOPERATIVE DIAGNOSIS:  L4-5 and L5-S1 herniated lumbar disk with  spondylosis, degenerative disks disease and radiculopathy.   PROCEDURE:  L4-5 and L5-S1 decompression with diskectomy with PEEK interbody  cages, morcellized bone auto- and allograft and pedicle screw fixation L4  through S1 levels bilaterally with posterolateral arthrodesis with  morcellized bone auto- and allograft.   SURGEON:  Danae Orleans. Venetia Maxon, M.D.   ASSISTANTMichail Jewels, R.N., and Hewitt Shorts, M.D.   ANESTHESIA:  General endotracheal anesthesia.   ESTIMATED BLOOD LOSS:  550 mL with 300 mL of Cell Saver blood returned to  the patient.   COMPLICATIONS:  None.   DISPOSITION:  To recovery.   INDICATIONS:  [name] is a 35 year old man with intractable back and  bilateral lower extremity pain, left greater than right.  He has undergone  extensive conservative management without relief of his pain including  extensive physical therapy and injections and a variety of treatments.  He  had a repeat MRI which showed worsening of disk herniation, particularly at  the L4-5 level, but he also has a central disk herniation at the L5-S1  levels.  It was elected to take him to surgery for decompression and fusion  at these two affected levels.   PROCEDURE:  [name] was brought to the operating room.  Following a  satisfactory and uncomplicated induction of general endotracheal anesthesia  and placement of intravenous lines, the patient  was placed in the prone  position.  After Foley catheter was placed, he was placed in prone position  on flat rolls.  His low back was then shaved, prepped and draped in the  usual sterile fashion.  The area of skin incision was infiltrated with 0.25%  Marcaine and 0.5% lidocaine and 1:200,000 epinephrine.  Incision was made in  the midline, carried through the lumbodorsal fascia which was incised  bilaterally.  Subperiosteal dissection was performed exposing the L4-L5  transverse processes bilaterally and the sacral ala bilaterally.  Intraoperative x-ray confirmed correct orientation.  Bilateral  hemilaminectomies of L4-L5 were then performed with decompression of the  thecal sac and exiting nerve roots.  Disk herniations were identified  bilaterally at the L4-5 and L5-S1 levels.  Using D'Errico nerve root  retractor under loupe magnification, the interspace was incised, and disk  material was removed in piecemeal fashion initially on the left side of  midline and subsequently on the right.  Trial spacers were placed, and  shavers were used to remove cartilaginous end plates to get down to bleeding  bone.  After trial spacers were placed, attention was then  turned to the  right side where similar decompression was performed, and again aggressive  diskectomy was performed at the L4-5 and L5-S1 level.  After trial sizing,  interbody cages were placed, 12 mm at L5-S1 and 16 mm at L4-5.  All cages  appeared to fit snuggly and were packed with morcellized bone autograft and  Osteocel.  Additional bone graft was packed in through the midline and also  lateral to the cages, and then on the left side of midline, similarly sized  opposing cages were placed.  Positioning of the cages was then confirmed on  the fluoroscopic visualization, and the cages appeared to be well  positioned, both in the lateral and AP projection.  Additional bone graft  was then placed overlying the cages and tamped in  position.  Pedicle screw  fixation was then placed with 6.5 x 45-mm sacral screws at the sacrum, a 6.5  x 45-mm screws at L4 and one 6.5 x 40 at L5, and on the right side a 6.5 x  45-mm screw.  All screws had excellent purchase, and their positioning was  confirmed on AP and lateral fluoroscopy.  The left L4 screw appeared to have  a lateral cut out on the AP projection, and the screw was repositioned and  appeared to be within bone and without any evidence of any cut out.  Subsequently, 70-mm pre-lordosed rods were affixed to the heads of the  screws after decorticating the remaining facet joint complex in the  posterolateral region, and then additional remaining bone graft was then  placed in the intertransverse region and overlying the facet joint  complexes.  Screws were locked down in situ, and self-retaining retractor  was removed.  Lumbodorsal fascia was closed with 1 Vicryl sutures.  Subcutaneous tissues were approximated with 2-0 Vicryl interrupted inverted  sutures, and skin edges were reapproximated with interrupted 3-0 Vicryl  subcuticular stitch.  The wound was dressed with benzoin, Steri-Strips,  Telfa gauze and tape.  The patient was taken the recovery room in stable and  satisfactory condition, having tolerated this operation well.  Counts were  correct at the end of the case.      Danae Orleans. Venetia Maxon, M.D.  Electronically Signed     JDS/MEDQ  D:  02/15/2006  T:  02/15/2006  Job:  [id number]

## 2010-08-21 NOTE — H&P (Signed)
NAME:  [name], [name]                           ACCOUNT NO.:  [id number]   MEDICAL RECORD NO.:  [id number]                   PATIENT TYPE:  IPS   LOCATION:  0508                                 [facility]ACILITY[facility]  [facility]   PHYSICIAN:  Geoffery Lyons, M.D.                   DATE OF BIRTH:  22-Sep-1975   DATE OF ADMISSION:  07/15/2003  DATE OF DISCHARGE:  07/18/2003                         PSYCHIATRIC ADMISSION ASSESSMENT   IDENTIFYING INFORMATION:  This is a 35 year old married white male,  voluntarily admitted on July 15, 2003.   HISTORY OF PRESENT ILLNESS:  The patient presents with a history of alcohol  abuse, drinking a 6-pack on a weekday and drinking a 12-pack on Friday and  20 beers on the weekends.  He states his drinking has been escalating for  the past 3 years.  He is seeking help.  He has tried to stop on his own but  found that he was having some withdrawal symptoms, felt like his heart has  been racing.  The patient states when he drinks he drinks to get drunk.  He  feels very anxious.  He denies any depressive symptoms, suicidal or  homicidal ideation or psychosis.  His sleep has been increased, his appetite  has been satisfactory.   PAST PSYCHIATRIC HISTORY:  First admission to [facility], no  history of being detoxed before.  Longest history of sobriety has been 5  days.  That was 3 years ago.   SOCIAL HISTORY:  He is a 35 year old married white male, married for a year,  first marriage, no children.  He works at [employer].  No legal problems.  No  history of a DUI.   FAMILY HISTORY:  Aunt and ankles both had problems with drinking.   ALCOHOL DRUG HISTORY:  Nonsmoker, denies any drug use, states he first drank  at age of 78, never drinks at work, no early morning drinking, no blackouts,  no seizures.   PAST MEDICAL HISTORY:  Primary care Alix Stowers is [facility].  Medical problems are elevated cholesterol.   MEDICATIONS:  None.   DRUG ALLERGIES:  No known  allergies.   PHYSICAL EXAMINATION:  Done at [facility] which was reviewed.  The patient  was given a mg of Ativan.  His vital signs are stable.  He is a tall, well-  nourished, well-developed male.   LABORATORY DATA:  His urinalysis was negative.  WBC is 3.8.  Urine drug  screen was negative.  Alcohol level was 60.  BMET was within normal limits.   MENTAL STATUS EXAM:  He is an alert, well-nourished male, cooperative, fair  eye contact.  Speech is clear.  The patient feels anxious.  He also appears  somewhat anxious.  Thought processes are coherent.  There is no evidence of  psychosis.  Cognitive function intact.  Memory is fair.  Judgment is good.  Insight is fair  to good.  Poor impulse control.   ADMISSION DIAGNOSES:   AXIS I:  1. Alcohol abuse rule out dependence.  2. Rule out anxiety disorder not otherwise specified.   AXIS II:  Deferred.   AXIS III:  Elevated cholesterol.   AXIS IV:  Other psychosocial problems related to alcohol abuse.   AXIS V:  Current is 35, past year 33-70.   PLAN:  Voluntary admission for depression and suicidal ideation.  Contract  for safety, check every 15 minutes.  Will stabilize patient.  Will initiate  an antidepressant.  Risks and benefits were discussed.  Relapse prevention  while the patient is hospitalized.  The patient is to follow up with AA  meetings. Consider a family session and will detox safely.   TENTATIVE LENGTH OF CARE:  3-4 days.     Landry Corporal, N.P.                       Geoffery Lyons, M.D.    JO/MEDQ  D:  07/18/2003  T:  07/19/2003  Job:  [id number]

## 2010-08-24 ENCOUNTER — Other Ambulatory Visit: Payer: Self-pay | Admitting: *Deleted

## 2010-08-24 MED ORDER — HYDROCODONE-ACETAMINOPHEN 10-325 MG PO TABS: ORAL_TABLET | ORAL | Status: DC

## 2010-08-24 NOTE — Telephone Encounter (Signed)
Called in by MD>

## 2010-08-28 ENCOUNTER — Ambulatory Visit (INDEPENDENT_AMBULATORY_CARE_PROVIDER_SITE_OTHER): Payer: BC Managed Care – PPO | Admitting: Family Medicine

## 2010-08-28 ENCOUNTER — Encounter: Payer: Self-pay | Admitting: Family Medicine

## 2010-08-28 VITALS — BP 102/80 | HR 88 | Temp 98.6°F | Ht 74.0 in | Wt 225.4 lb

## 2010-08-28 DIAGNOSIS — L0390 Cellulitis, unspecified: Secondary | ICD-10-CM

## 2010-08-28 DIAGNOSIS — L0291 Cutaneous abscess, unspecified: Secondary | ICD-10-CM

## 2010-08-28 MED ORDER — CEPHALEXIN 500 MG PO CAPS: 500.0000 mg | ORAL_CAPSULE | Freq: Four times a day (QID) | ORAL | Status: AC

## 2010-08-28 NOTE — Progress Notes (Signed)
Leg pain.  Medial, superior to L knee.  Noted 1 week ago, present for 3 days, self resolved, then came back today.  He didn't remember it being sore last week, just the redness. It is tender today.  No trigger known.  No known bite or trauma.    Meds, vitals, and allergies reviewed.   ROS: See HPI.  Otherwise, noncontributory.  nad L leg with erythema medial, superior to L knee.  6.5x4cm.  No fluctuance.  Hamstring and quad not ttp.  Normal rom of knee.  Gait not affected.

## 2010-08-28 NOTE — Patient Instructions (Signed)
Take the keflex 4 times a day and let me know if the redness gets bigger or doesn't gradually resolve.  Take care.

## 2010-08-29 NOTE — Assessment & Plan Note (Signed)
Unclear source. Start keflex and border marked.  Notify clinic if spreading erythema.  He agrees.  No indication for I&D .

## 2010-09-01 ENCOUNTER — Ambulatory Visit: Payer: Self-pay | Admitting: Family Medicine

## 2010-09-20 ENCOUNTER — Other Ambulatory Visit: Payer: Self-pay | Admitting: Family Medicine

## 2010-09-21 ENCOUNTER — Other Ambulatory Visit: Payer: Self-pay | Admitting: *Deleted

## 2010-09-21 MED ORDER — HYDROCODONE-ACETAMINOPHEN 10-325 MG PO TABS: ORAL_TABLET | ORAL | Status: DC

## 2010-09-21 NOTE — Telephone Encounter (Signed)
Will route to PCP 

## 2010-09-21 NOTE — Telephone Encounter (Signed)
Rx called to [facility]. 

## 2010-09-21 NOTE — Telephone Encounter (Signed)
Please call in.  I already sent the tramadol.  Thanks.

## 2010-10-21 ENCOUNTER — Other Ambulatory Visit: Payer: Self-pay | Admitting: *Deleted

## 2010-10-21 MED ORDER — TRAMADOL HCL 50 MG PO TABS: 100.0000 mg | ORAL_TABLET | Freq: Four times a day (QID) | ORAL | Status: DC | PRN

## 2010-10-21 MED ORDER — HYDROCODONE-ACETAMINOPHEN 10-325 MG PO TABS: ORAL_TABLET | ORAL | Status: DC

## 2010-10-21 NOTE — Telephone Encounter (Signed)
Please call in.  Thanks.   

## 2010-10-21 NOTE — Telephone Encounter (Signed)
Rx called in as directed.   

## 2010-10-21 NOTE — Telephone Encounter (Signed)
Last filled 09/21/10.

## 2010-10-21 NOTE — Telephone Encounter (Signed)
Received faxed refill request from pharmacy. Is it okay to refill this medication? Please verify dispense amount and number of refills.

## 2010-11-16 ENCOUNTER — Other Ambulatory Visit: Payer: Self-pay | Admitting: *Deleted

## 2010-11-16 MED ORDER — CYCLOBENZAPRINE HCL 10 MG PO TABS: 10.0000 mg | ORAL_TABLET | Freq: Every day | ORAL | Status: DC | PRN

## 2010-11-16 NOTE — Telephone Encounter (Signed)
Received faxed refill request from pharmacy for Cyclobenzaprine 10 mg, take 1 to 1 and 1/2 tablets by mouth once daily for spasm or for sleep. This is not on medication list,  last refill on 08/17/2010 for #135 per pharmacy.. Please advise

## 2010-11-16 NOTE — Telephone Encounter (Signed)
Okay to continue, rx sent.

## 2010-11-19 ENCOUNTER — Other Ambulatory Visit: Payer: Self-pay | Admitting: *Deleted

## 2010-11-22 MED ORDER — HYDROCODONE-ACETAMINOPHEN 10-325 MG PO TABS: ORAL_TABLET | ORAL | Status: DC

## 2010-11-22 MED ORDER — TRAMADOL HCL 50 MG PO TABS: 100.0000 mg | ORAL_TABLET | Freq: Four times a day (QID) | ORAL | Status: DC | PRN

## 2010-11-22 NOTE — Telephone Encounter (Signed)
Please call in vicodin.  Tramadol rx sent.

## 2010-11-23 NOTE — Telephone Encounter (Signed)
Medication phoned to pharmacy.  

## 2010-12-23 ENCOUNTER — Other Ambulatory Visit: Payer: Self-pay | Admitting: *Deleted

## 2010-12-23 MED ORDER — HYDROCODONE-ACETAMINOPHEN 10-325 MG PO TABS: ORAL_TABLET | ORAL | Status: DC

## 2010-12-23 NOTE — Telephone Encounter (Signed)
Medication phoned to [facility] [address] pharmacy as instructed.

## 2010-12-23 NOTE — Telephone Encounter (Signed)
Px written for call in   

## 2010-12-28 ENCOUNTER — Other Ambulatory Visit: Payer: Self-pay | Admitting: *Deleted

## 2010-12-28 NOTE — Telephone Encounter (Signed)
Received faxed refill request from pharmacy for Tramadol 50 mg. Last refilled on 11/23/10 #240.  Is it okay to refill medication?

## 2010-12-29 MED ORDER — TRAMADOL HCL 50 MG PO TABS: 100.0000 mg | ORAL_TABLET | Freq: Four times a day (QID) | ORAL | Status: DC | PRN

## 2010-12-29 NOTE — Telephone Encounter (Signed)
Sent!

## 2011-01-21 ENCOUNTER — Other Ambulatory Visit: Payer: Self-pay | Admitting: *Deleted

## 2011-01-21 MED ORDER — TRAMADOL HCL 50 MG PO TABS: 100.0000 mg | ORAL_TABLET | Freq: Four times a day (QID) | ORAL | Status: DC | PRN

## 2011-01-21 MED ORDER — HYDROCODONE-ACETAMINOPHEN 10-325 MG PO TABS: ORAL_TABLET | ORAL | Status: DC

## 2011-01-21 NOTE — Telephone Encounter (Signed)
Please call in and have pt schedule a physical in the next few months.  Thanks.  I already send the tramadol as this was going to be due soon; this way both went in together.

## 2011-01-22 ENCOUNTER — Other Ambulatory Visit: Payer: Self-pay | Admitting: *Deleted

## 2011-01-22 NOTE — Telephone Encounter (Signed)
Please clarify with pharmacy as this should have gone in yesterday.  Thanks.

## 2011-01-22 NOTE — Telephone Encounter (Signed)
Medication phoned to pharmacy.  

## 2011-02-22 ENCOUNTER — Other Ambulatory Visit: Payer: Self-pay | Admitting: Family Medicine

## 2011-02-22 NOTE — Telephone Encounter (Signed)
Please call in hydrocodone rx.  Thanks.

## 2011-02-22 NOTE — Telephone Encounter (Signed)
Medication phoned to pharmacy.  

## 2011-02-23 ENCOUNTER — Other Ambulatory Visit: Payer: Self-pay | Admitting: *Deleted

## 2011-03-24 ENCOUNTER — Other Ambulatory Visit: Payer: Self-pay | Admitting: Family Medicine

## 2011-03-24 NOTE — Telephone Encounter (Signed)
Please call in the hydrocodone rx.  I sent the tramadol. Please set up patient for OV in next 1-2 months.  OV.  Thanks.

## 2011-03-24 NOTE — Telephone Encounter (Signed)
Medication phoned to pharmacy.  

## 2011-04-21 ENCOUNTER — Ambulatory Visit (INDEPENDENT_AMBULATORY_CARE_PROVIDER_SITE_OTHER): Payer: BC Managed Care – PPO | Admitting: Family Medicine

## 2011-04-21 ENCOUNTER — Encounter: Payer: Self-pay | Admitting: Family Medicine

## 2011-04-21 DIAGNOSIS — H6120 Impacted cerumen, unspecified ear: Secondary | ICD-10-CM

## 2011-04-21 DIAGNOSIS — M549 Dorsalgia, unspecified: Secondary | ICD-10-CM

## 2011-04-21 NOTE — Patient Instructions (Signed)
Take care and let me know what you think about options on the pain meds.

## 2011-04-21 NOTE — Progress Notes (Signed)
Back pain.  He wants to try to taper off the pain meds.  We talked about options.  He will consider slow taper of meds, and will let me know about his pain level.  No abuse or misuse of meds.    L ear feels full.  Hearing affected.  Uncomfortable.  No FCNAV.    Meds, vitals, and allergies reviewed.   ROS: See HPI.  Otherwise, noncontributory.  nad ncat L cerumen impaction.  Resolved with irrigation. Recheck pinna, canal TM wnl.  R pinna, canal TM wnl.  Op wnl

## 2011-04-22 ENCOUNTER — Encounter: Payer: Self-pay | Admitting: Family Medicine

## 2011-04-22 DIAGNOSIS — H6120 Impacted cerumen, unspecified ear: Secondary | ICD-10-CM | POA: Insufficient documentation

## 2011-04-22 NOTE — Assessment & Plan Note (Signed)
He'll attempt slow taper of meds.

## 2011-04-22 NOTE — Assessment & Plan Note (Signed)
Treated, resolved, fu prn.

## 2011-04-23 ENCOUNTER — Other Ambulatory Visit: Payer: Self-pay | Admitting: Family Medicine

## 2011-04-23 NOTE — Telephone Encounter (Signed)
Electronic refill request

## 2011-04-25 NOTE — Telephone Encounter (Signed)
Please call in hydrocodone rx.  Thanks.   

## 2011-04-26 NOTE — Telephone Encounter (Signed)
Medication phoned to pharmacy.  

## 2011-04-29 ENCOUNTER — Other Ambulatory Visit: Payer: Self-pay | Admitting: Family Medicine

## 2011-04-29 ENCOUNTER — Other Ambulatory Visit: Payer: Self-pay

## 2011-04-29 NOTE — Telephone Encounter (Signed)
[name] with [name] called pt was confused why tramadol was not approved. [name] said change was not appropriate. Explained Tramadol 50 mg was approved electronically for 240 tab. But then tramadol was requested again and that was denied. That was the reason for the explanation on refusal request that med had been requested x 2 and 1 request was filled and the other denied. [name] said she would take care of. See 04/23/11 refill.

## 2011-05-13 ENCOUNTER — Ambulatory Visit (INDEPENDENT_AMBULATORY_CARE_PROVIDER_SITE_OTHER): Payer: BC Managed Care – PPO | Admitting: Family Medicine

## 2011-05-13 ENCOUNTER — Encounter: Payer: Self-pay | Admitting: Family Medicine

## 2011-05-13 VITALS — BP 104/74 | HR 92 | Temp 97.4°F | Ht 74.0 in | Wt 229.0 lb

## 2011-05-13 DIAGNOSIS — R002 Palpitations: Secondary | ICD-10-CM

## 2011-05-13 NOTE — Progress Notes (Signed)
Father with AF, sister with SVT.  Felt irregular pulse at home.  Going on intermittently for last few months.  No known triggers.  No recent med changes.  He cut back on caffeine.  Can happen at rest. Can last a few minutes.  No frank CP but he gets an "uneasy" feeling.  Can get mildly SOB with or without the episodes.  He can sometimes exert himself w/o symptoms.    PMH and SH reviewed  ROS: See HPI, otherwise noncontributory.  Meds, vitals, and allergies reviewed.   GEN: nad, alert and oriented HEENT: mucous membranes moist NECK: supple w/o LA CV: rrr.  no murmur PULM: ctab, no inc wob ABD: soft, +bs EXT: no edema SKIN: no acute rash  EKG reviewed, wnl

## 2011-05-13 NOTE — Patient Instructions (Addendum)
Avoid caffeine and decongestants.  See Shirlee Limerick about your referral before you leave today. Let me know about the specific clinic and I'll see about getting the monitor done ahead of time.  Take care.

## 2011-05-14 ENCOUNTER — Encounter: Payer: Self-pay | Admitting: Family Medicine

## 2011-05-14 DIAGNOSIS — R002 Palpitations: Secondary | ICD-10-CM | POA: Insufficient documentation

## 2011-05-14 NOTE — Assessment & Plan Note (Signed)
No TMG, goiter on exam.  D/w pt, okay for outpatient f/u.  Will refer to cards for consideration of monitor.  If he'll get me a phone number, we'll contact clinic about getting monitor set up ahead of time, if possible.  He agrees with plan.  >25 min spent with face to face with patient, >50% counseling and/or coordinating care.

## 2011-05-21 ENCOUNTER — Other Ambulatory Visit: Payer: Self-pay | Admitting: Family Medicine

## 2011-05-21 NOTE — Telephone Encounter (Signed)
Pharmacy called, need Rx .Marland Kitchen

## 2011-05-23 MED ORDER — CYCLOBENZAPRINE HCL 10 MG PO TABS: 10.0000 mg | ORAL_TABLET | Freq: Every day | ORAL | Status: DC | PRN

## 2011-05-23 NOTE — Telephone Encounter (Signed)
Sent!

## 2011-05-24 NOTE — Telephone Encounter (Signed)
Electronic refill request

## 2011-05-25 NOTE — Telephone Encounter (Signed)
This was sent recently.  Please clarify with pharmacy.  If it did go through prev, then cancel this version of the rx.  Thanks.

## 2011-05-27 ENCOUNTER — Other Ambulatory Visit: Payer: Self-pay | Admitting: Family Medicine

## 2011-05-28 NOTE — Telephone Encounter (Signed)
Medication phoned to pharmacy.  

## 2011-05-28 NOTE — Telephone Encounter (Signed)
Please call in the vicodin.  Thanks.   

## 2011-06-24 ENCOUNTER — Other Ambulatory Visit: Payer: Self-pay | Admitting: Family Medicine

## 2011-06-25 NOTE — Telephone Encounter (Signed)
Electronic refill request

## 2011-06-27 NOTE — Telephone Encounter (Signed)
Tramadol sent, please call in hydrocodone.

## 2011-06-28 NOTE — Telephone Encounter (Signed)
Medication phoned to pharmacy.  

## 2011-07-26 ENCOUNTER — Other Ambulatory Visit: Payer: Self-pay | Admitting: Family Medicine

## 2011-07-27 NOTE — Telephone Encounter (Signed)
Electronic refill requests.

## 2011-07-28 NOTE — Telephone Encounter (Signed)
Pt left v/m he is out of medication (Tramadol and Norco) and request refill sent to [facility] [address]. Pt can be reached at [phone]. Since Dr Para March may not be reviewing computer today please advise.

## 2011-07-28 NOTE — Telephone Encounter (Signed)
Please call in the norco.  Tramadol sent.  Thanks.

## 2011-07-28 NOTE — Telephone Encounter (Signed)
Rx called to pharmacy. Patient notified by telephone. 

## 2011-08-24 ENCOUNTER — Other Ambulatory Visit: Payer: Self-pay | Admitting: Family Medicine

## 2011-08-24 NOTE — Telephone Encounter (Signed)
Electronic refill request

## 2011-08-25 MED ORDER — HYDROCODONE-ACETAMINOPHEN 10-325 MG PO TABS: ORAL_TABLET | ORAL | Status: DC

## 2011-08-25 NOTE — Telephone Encounter (Signed)
Rx called to pharmacy

## 2011-08-25 NOTE — Telephone Encounter (Signed)
Please call in hydrocodone rx.  Tramadol sent.  Thanks.

## 2011-09-27 ENCOUNTER — Other Ambulatory Visit: Payer: Self-pay | Admitting: Family Medicine

## 2011-09-27 MED ORDER — HYDROCODONE-ACETAMINOPHEN 10-325 MG PO TABS: ORAL_TABLET | ORAL | Status: DC

## 2011-09-27 NOTE — Telephone Encounter (Signed)
Electronic refill request

## 2011-09-27 NOTE — Telephone Encounter (Signed)
Please call in the hydrocodone rx.  Tramadol sent.

## 2011-09-28 NOTE — Telephone Encounter (Signed)
Medication phoned to pharmacy.  

## 2011-10-25 ENCOUNTER — Other Ambulatory Visit: Payer: Self-pay | Admitting: Family Medicine

## 2011-10-25 NOTE — Telephone Encounter (Signed)
Received refill request electronically from pharmacy. Is it okay to refill medication? 

## 2011-10-26 NOTE — Telephone Encounter (Signed)
Sent!

## 2011-11-04 ENCOUNTER — Other Ambulatory Visit: Payer: Self-pay | Admitting: *Deleted

## 2011-11-04 MED ORDER — HYDROCODONE-ACETAMINOPHEN 10-325 MG PO TABS: ORAL_TABLET | ORAL | Status: DC

## 2011-11-04 NOTE — Telephone Encounter (Signed)
Please call in

## 2011-11-04 NOTE — Telephone Encounter (Signed)
Faxed refill request   

## 2011-11-04 NOTE — Telephone Encounter (Signed)
Medication phoned to pharmacy.  

## 2011-11-24 ENCOUNTER — Other Ambulatory Visit: Payer: Self-pay | Admitting: Family Medicine

## 2011-11-24 DIAGNOSIS — M6740 Ganglion, unspecified site: Secondary | ICD-10-CM

## 2011-11-24 NOTE — Telephone Encounter (Signed)
Sent, please get him scheduled for this fall.  Thanks.

## 2011-11-24 NOTE — Telephone Encounter (Signed)
Received faxed refill request from pharmacy. Last office visit 05/13/11. Is it okay to refill medication? 

## 2011-11-25 NOTE — Telephone Encounter (Signed)
Patient advised and agrees to referral.  °

## 2011-11-25 NOTE — Telephone Encounter (Signed)
Patient advised to schedule appt.  He says he is having problems now with the ganglion cyst that he has had in the past.  It has come back now and has been there for some time and is hindering him doing certain tasks.  He is asking if he needs to see you or go elsewhere.  He understands that you have told him in the past that they will go away but this one hasn't for quite some time and is bothersome.

## 2011-11-25 NOTE — Telephone Encounter (Signed)
Some of them do resolve on their own.  If not improved, I'd have him see the hand center. Referral is in.  Can cancel the referral if he declined referral.

## 2011-12-23 ENCOUNTER — Ambulatory Visit (INDEPENDENT_AMBULATORY_CARE_PROVIDER_SITE_OTHER): Payer: BC Managed Care – PPO | Admitting: Family Medicine

## 2011-12-23 ENCOUNTER — Encounter: Payer: Self-pay | Admitting: Family Medicine

## 2011-12-23 VITALS — BP 106/60 | HR 80 | Temp 97.5°F | Wt 217.5 lb

## 2011-12-23 DIAGNOSIS — Z23 Encounter for immunization: Secondary | ICD-10-CM

## 2011-12-23 DIAGNOSIS — M549 Dorsalgia, unspecified: Secondary | ICD-10-CM

## 2011-12-23 MED ORDER — TRAMADOL HCL 50 MG PO TABS: ORAL_TABLET | ORAL | Status: DC

## 2011-12-23 MED ORDER — HYDROCODONE-ACETAMINOPHEN 10-325 MG PO TABS: ORAL_TABLET | ORAL | Status: DC

## 2011-12-23 MED ORDER — FLUTICASONE PROPIONATE 50 MCG/ACT NA SUSP: 2.0000 | Freq: Every day | NASAL | Status: DC

## 2011-12-23 NOTE — Progress Notes (Signed)
He had f/u with cards with reassuring w/u.  He has f/u with hand center pending re: ganglion.   Wife is due 02/03/12.  He's happy about the upcoming family changes.  Needs tdap and flu shot.   Back pain continues.  He tried to taper off the hydrocodone but the pain wasn't well controlled. Still stretching and working out.  Still with lower back pain, middle and left of middle, but not radicular.  Flexeril helps some, esp with sleep.   Meds, vitals, and allergies reviewed.   ROS: See HPI.  Otherwise, noncontributory.  nad ncat rrr ctab Back w/old lumbar scar noted Ganglion noted on L wrist Gait wnl

## 2011-12-23 NOTE — Patient Instructions (Addendum)
Take care.  congrats on the baby.  Good luck.  Call with questions.

## 2011-12-24 DIAGNOSIS — Z23 Encounter for immunization: Secondary | ICD-10-CM | POA: Insufficient documentation

## 2011-12-24 NOTE — Assessment & Plan Note (Signed)
He tried to taper of opiates and pain isn't controlled.  He continues to stretch and do everything he can to get the most benefit from meds.  Isn't requiring escalation.  i would continue opiate/tramadol and flexeril.  He agrees.

## 2011-12-24 NOTE — Assessment & Plan Note (Signed)
Done today.

## 2012-01-20 ENCOUNTER — Other Ambulatory Visit: Payer: Self-pay | Admitting: Family Medicine

## 2012-01-21 ENCOUNTER — Other Ambulatory Visit: Payer: Self-pay | Admitting: Family Medicine

## 2012-01-21 NOTE — Telephone Encounter (Signed)
Please call in

## 2012-01-21 NOTE — Telephone Encounter (Signed)
Sent!

## 2012-01-21 NOTE — Telephone Encounter (Signed)
Electronic refill request

## 2012-01-21 NOTE — Telephone Encounter (Signed)
Medication phoned to pharmacy.  

## 2012-02-08 ENCOUNTER — Telehealth: Payer: Self-pay | Admitting: Family Medicine

## 2012-02-08 NOTE — Telephone Encounter (Signed)
Called and LMOVM.  Pt had told me his wife was due at the end of 10/13.  I wished them well on the message.

## 2012-02-17 ENCOUNTER — Other Ambulatory Visit: Payer: Self-pay | Admitting: Family Medicine

## 2012-02-17 NOTE — Telephone Encounter (Signed)
Electronic refill request

## 2012-02-18 NOTE — Telephone Encounter (Signed)
Medication phoned to pharmacy.  

## 2012-02-18 NOTE — Telephone Encounter (Signed)
Please call in vicodin.  Tramadol sent.

## 2012-03-06 ENCOUNTER — Other Ambulatory Visit: Payer: Self-pay | Admitting: Family Medicine

## 2012-03-06 NOTE — Telephone Encounter (Signed)
Electronic refill request.  Please advise. 

## 2012-03-07 ENCOUNTER — Telehealth: Payer: Self-pay | Admitting: Family Medicine

## 2012-03-07 ENCOUNTER — Ambulatory Visit (INDEPENDENT_AMBULATORY_CARE_PROVIDER_SITE_OTHER): Payer: BC Managed Care – PPO | Admitting: Family Medicine

## 2012-03-07 ENCOUNTER — Encounter: Payer: Self-pay | Admitting: Family Medicine

## 2012-03-07 VITALS — BP 90/64 | HR 50 | Temp 97.9°F | Ht 74.0 in | Wt 209.2 lb

## 2012-03-07 DIAGNOSIS — L03119 Cellulitis of unspecified part of limb: Secondary | ICD-10-CM

## 2012-03-07 DIAGNOSIS — S61411A Laceration without foreign body of right hand, initial encounter: Secondary | ICD-10-CM | POA: Insufficient documentation

## 2012-03-07 DIAGNOSIS — L02519 Cutaneous abscess of unspecified hand: Secondary | ICD-10-CM | POA: Insufficient documentation

## 2012-03-07 DIAGNOSIS — S61409A Unspecified open wound of unspecified hand, initial encounter: Secondary | ICD-10-CM

## 2012-03-07 MED ORDER — CEPHALEXIN 500 MG PO CAPS: 500.0000 mg | ORAL_CAPSULE | Freq: Three times a day (TID) | ORAL | Status: DC

## 2012-03-07 NOTE — Progress Notes (Signed)
  Subjective:    Patient ID: [name], male    DOB: 03-15-1976, 36 y.o.   MRN: [id number]  HPI  36 year old male presents with cut on right dorsal hand x 1 week ago. He cut back of hand when he was wood working, skin was pushed back.  In last few days he has noted some red bumps with pustules appear. Has noted some discharge, clear. No fever. No flu like symptoms.  Uptodate with Tdap. Review of Systems  Constitutional: Negative for fever and fatigue.  HENT: Negative for ear pain.   Respiratory: Negative for shortness of breath and wheezing.   Cardiovascular: Negative for chest pain, palpitations and leg swelling.  Gastrointestinal: Negative for abdominal pain.       Objective:   Physical Exam  Constitutional: He is oriented to person, place, and time. He appears well-developed and well-nourished.  HENT:  Head: Normocephalic.  Eyes: Pupils are equal, round, and reactive to light.  Neck: Normal range of motion. Neck supple.  Cardiovascular: Normal rate and regular rhythm.  Exam reveals no gallop and no friction rub.   No murmur heard. Pulmonary/Chest: Effort normal and breath sounds normal.  Neurological: He is alert and oriented to person, place, and time.  Skin:       Laceration on right posterior hand with surrounding erythema and clear blisters surrounding, itchy          Assessment & Plan:

## 2012-03-07 NOTE — Assessment & Plan Note (Signed)
Slow healing likely due to peroxide.

## 2012-03-07 NOTE — Telephone Encounter (Signed)
Sent!

## 2012-03-07 NOTE — Telephone Encounter (Signed)
Patient Information:  Caller Name: [name]  Phone: [phone]  Patient: [name], [name]  Gender: Male  DOB: Oct 16, 1975  Age: 36 Years  PCP: [name]) [facility])   Symptoms  Reason For Call & Symptoms: infected area with blisters to back of left hand.  Scraped back of hand/injury 02/29/12 but notes spreading redness with pus-filled small blisters around the scrape 03/06/12  Reviewed Health History In EMR: Yes  Reviewed Medications In EMR: Yes  Reviewed Allergies In EMR: Yes  Reviewed Surgeries / Procedures: Yes  Date of Onset of Symptoms: 02/29/2012  Guideline(s) Used:  Chief Financial Officer the webpage  Skin Injury  Disposition Per Guideline:   Go to Office Now  Reason For Disposition Reached:   Looks infected (fever, spreading redness, pus, or red streak)  Advice Given:  N/A  Office Follow Up:  Does the office need to follow up with this patient?: No  Instructions For The Office: N/A  Appointment Scheduled:  03/07/2012 15:30:00 Appointment Scheduled Provider:  Kerby Nora (Family Practice)  RN Note:  Per skin lesions protocol, emergent symptoms denied; advised appt today.  Appt scheduled first available 1530 03/07/12 with Dr. Ermalene Searing.  krs/can

## 2012-03-07 NOTE — Patient Instructions (Signed)
Wash with warm water and soap, apply OTC hydrocortisone cream to area 1-2 times daily, neosporin. Stop peroxide as it can inhibit healing in long term.  Start oral antibiotics x 7 days.

## 2012-03-07 NOTE — Assessment & Plan Note (Signed)
Rash around with clear blisters may be irritant derm from peroxide.. Stop this. Will cover for infection as well with keflex. Can use OTC hydrocortisone not on wound by surrounding rash.

## 2012-03-17 ENCOUNTER — Other Ambulatory Visit: Payer: Self-pay | Admitting: Family Medicine

## 2012-03-17 NOTE — Telephone Encounter (Signed)
Electronic refill request.  Please advise. 

## 2012-03-19 NOTE — Telephone Encounter (Signed)
Tramadol sent, please call in vicodin.

## 2012-04-18 ENCOUNTER — Other Ambulatory Visit: Payer: Self-pay | Admitting: Family Medicine

## 2012-04-18 NOTE — Telephone Encounter (Signed)
Rx phoned to pharmacy.  

## 2012-04-18 NOTE — Telephone Encounter (Signed)
Tramadol sent, please call in vicodin.  Thanks.

## 2012-04-18 NOTE — Telephone Encounter (Signed)
Electronic refill requests.  Please advise. 

## 2012-05-17 ENCOUNTER — Other Ambulatory Visit: Payer: Self-pay | Admitting: Family Medicine

## 2012-05-17 MED ORDER — HYDROCODONE-ACETAMINOPHEN 10-325 MG PO TABS: ORAL_TABLET | ORAL | Status: DC

## 2012-05-17 NOTE — Telephone Encounter (Signed)
plz phone in hydrocodone 

## 2012-05-17 NOTE — Telephone Encounter (Signed)
Rx called in as directed.   

## 2012-05-20 ENCOUNTER — Other Ambulatory Visit: Payer: Self-pay | Admitting: Family Medicine

## 2012-05-20 NOTE — Telephone Encounter (Signed)
Called pharmacy, this was a duplicate request, request cancelled.

## 2012-05-20 NOTE — Telephone Encounter (Signed)
Please clarify that this was called in already.  Thanks.

## 2012-06-13 ENCOUNTER — Other Ambulatory Visit: Payer: Self-pay | Admitting: Family Medicine

## 2012-06-13 NOTE — Telephone Encounter (Signed)
Please call in hydrocodone; tramadol sent.

## 2012-06-13 NOTE — Telephone Encounter (Signed)
Route to PCP

## 2012-06-14 NOTE — Telephone Encounter (Signed)
Hydrocodone phoned in to [facility].

## 2012-07-12 ENCOUNTER — Other Ambulatory Visit: Payer: Self-pay | Admitting: Family Medicine

## 2012-07-12 NOTE — Telephone Encounter (Signed)
Please call in

## 2012-07-12 NOTE — Telephone Encounter (Signed)
Sent!

## 2012-07-13 NOTE — Telephone Encounter (Signed)
Rx phoned to pharmacy.  

## 2012-08-14 ENCOUNTER — Other Ambulatory Visit: Payer: Self-pay | Admitting: Family Medicine

## 2012-08-14 NOTE — Telephone Encounter (Signed)
Electronic refill request.  Please advise. 

## 2012-08-14 NOTE — Telephone Encounter (Signed)
Medication phoned to pharmacy.  

## 2012-08-14 NOTE — Telephone Encounter (Signed)
Please call in hydrocodone; tramadol sent.

## 2012-09-11 ENCOUNTER — Other Ambulatory Visit: Payer: Self-pay | Admitting: Family Medicine

## 2012-09-12 ENCOUNTER — Other Ambulatory Visit: Payer: Self-pay | Admitting: Family Medicine

## 2012-09-12 MED ORDER — HYDROCODONE-ACETAMINOPHEN 10-325 MG PO TABS: ORAL_TABLET | ORAL | Status: DC

## 2012-09-12 NOTE — Telephone Encounter (Signed)
Patient advised to schedule appt later this summer.

## 2012-09-12 NOTE — Telephone Encounter (Signed)
Medication phoned to pharmacy.  

## 2012-09-12 NOTE — Telephone Encounter (Signed)
Electronic refill request.  Please advise. 

## 2012-09-12 NOTE — Telephone Encounter (Signed)
Please call in hydrocodone, tramadol sent.  Schedule for later in the summer.  Thanks.

## 2012-10-11 ENCOUNTER — Other Ambulatory Visit: Payer: Self-pay | Admitting: Family Medicine

## 2012-10-12 ENCOUNTER — Other Ambulatory Visit: Payer: Self-pay | Admitting: Family Medicine

## 2012-10-12 NOTE — Telephone Encounter (Signed)
Electronic refill request.  Please advise. 

## 2012-10-12 NOTE — Telephone Encounter (Signed)
tramadol was sent. Thanks.

## 2012-10-12 NOTE — Telephone Encounter (Signed)
Please call in hydrocodone, thanks.

## 2012-10-13 NOTE — Telephone Encounter (Signed)
Medication phoned to pharmacy.  

## 2012-10-23 ENCOUNTER — Ambulatory Visit: Payer: BC Managed Care – PPO | Admitting: Family Medicine

## 2012-10-24 ENCOUNTER — Encounter: Payer: Self-pay | Admitting: *Deleted

## 2012-10-24 ENCOUNTER — Encounter: Payer: Self-pay | Admitting: Family Medicine

## 2012-10-24 ENCOUNTER — Ambulatory Visit (INDEPENDENT_AMBULATORY_CARE_PROVIDER_SITE_OTHER): Payer: BC Managed Care – PPO | Admitting: Family Medicine

## 2012-10-24 VITALS — BP 106/68 | HR 81 | Temp 98.0°F | Wt 203.2 lb

## 2012-10-24 DIAGNOSIS — M549 Dorsalgia, unspecified: Secondary | ICD-10-CM

## 2012-10-24 MED ORDER — TIZANIDINE HCL 4 MG PO TABS: 4.0000 mg | ORAL_TABLET | Freq: Every evening | ORAL | Status: DC | PRN

## 2012-10-24 NOTE — Patient Instructions (Signed)
Try the tizanidine instead of the flexeril and let me know if that helps.   Take care.  Glad to see you. I would get a flu shot each fall.

## 2012-10-25 ENCOUNTER — Encounter: Payer: Self-pay | Admitting: Family Medicine

## 2012-10-25 NOTE — Progress Notes (Signed)
Continue with back pain- lower back, B, just lateral of midline- treated with baseline meds w/o ADE and consistent stretching.  No new trauma.  The flexeril isn't helping as much as prev.  No weakness.  No other complaints.  Daughter Andi Hence was born last year.  He and family are doing well.    Meds, vitals, and allergies reviewed.   ROS: See HPI.  Otherwise, noncontributory.  nad ncat Mmm rrr ctab abd soft Back w/o midline pain but ttp just lateral to midline in the L spine Distally nv intact.

## 2012-10-25 NOTE — Assessment & Plan Note (Signed)
Would try tizanidine instead of flexeril at night and continue other meds, stretching.  No substance abuse and doing well o/w.  He agrees.  Report back as needed.

## 2012-11-15 ENCOUNTER — Other Ambulatory Visit: Payer: Self-pay | Admitting: Family Medicine

## 2012-11-16 ENCOUNTER — Other Ambulatory Visit: Payer: Self-pay | Admitting: Family Medicine

## 2012-11-16 MED ORDER — HYDROCODONE-ACETAMINOPHEN 10-325 MG PO TABS: ORAL_TABLET | ORAL | Status: DC

## 2012-11-16 NOTE — Telephone Encounter (Signed)
Please call both in

## 2012-11-16 NOTE — Telephone Encounter (Signed)
Electronic refill request.  Please advise. 

## 2012-11-17 NOTE — Telephone Encounter (Signed)
Medication phoned to pharmacy.  

## 2012-11-24 ENCOUNTER — Other Ambulatory Visit: Payer: Self-pay | Admitting: Family Medicine

## 2012-11-27 NOTE — Telephone Encounter (Signed)
Electronic refill request.  Please advise. 

## 2012-11-27 NOTE — Telephone Encounter (Signed)
Sent!

## 2012-12-12 ENCOUNTER — Other Ambulatory Visit: Payer: Self-pay | Admitting: Family Medicine

## 2012-12-12 NOTE — Telephone Encounter (Signed)
Electronic refill request.  Please advise. 

## 2012-12-13 NOTE — Telephone Encounter (Signed)
Please clarify with pharmacy.  He should have a refill on this.

## 2012-12-15 ENCOUNTER — Other Ambulatory Visit: Payer: Self-pay | Admitting: Family Medicine

## 2012-12-15 NOTE — Telephone Encounter (Signed)
Electronic refill request.  Please advise. 

## 2012-12-16 NOTE — Telephone Encounter (Signed)
Please call in.  Thanks.   

## 2012-12-18 ENCOUNTER — Other Ambulatory Visit: Payer: Self-pay | Admitting: Family Medicine

## 2012-12-18 NOTE — Telephone Encounter (Signed)
Please call in

## 2012-12-18 NOTE — Telephone Encounter (Signed)
Electronic refill request.  Please advise. 

## 2012-12-18 NOTE — Telephone Encounter (Signed)
Rx called to pharmacy as instructed. 

## 2012-12-19 ENCOUNTER — Ambulatory Visit: Payer: BC Managed Care – PPO | Admitting: Family Medicine

## 2012-12-19 ENCOUNTER — Telehealth: Payer: Self-pay

## 2012-12-19 DIAGNOSIS — Z0289 Encounter for other administrative examinations: Secondary | ICD-10-CM

## 2012-12-19 NOTE — Telephone Encounter (Signed)
I didn't know about this until after the fact- I could have/would have seen him.  I wish I had been notified.  I need to know if something like this happens again, especially given his driving distance.  I appreciate G's help on this tomorrow.  I wrote a note to be given to the patient tomorrow.

## 2012-12-19 NOTE — Telephone Encounter (Signed)
Medication phoned to pharmacy.  

## 2012-12-19 NOTE — Telephone Encounter (Signed)
Pt called and was hear at 1:45 pm for appt with Dr Salina April told by front desk that his appt was 11:45 and Dr Para March could not work pt in today. Pt left but called back to apologize for the mix up with his appt but  Pt was sure he was told 1:45 pm. Offered pt to come back to see Ms. Baity today at 3:30. Pt said he could not return today but could come in the morning around 9:30 am. Advised Dr Para March was not in office until afternoon but pt scheduled with Dr Reece Agar 12/20/12 at 9:15 am. Pt said neck pain started on and off since 12/16/12; no know injury. If pt pushed on front of throat it hurts and also having spasms in back of neck.

## 2012-12-20 ENCOUNTER — Encounter: Payer: Self-pay | Admitting: Family Medicine

## 2012-12-20 ENCOUNTER — Ambulatory Visit (INDEPENDENT_AMBULATORY_CARE_PROVIDER_SITE_OTHER): Payer: BC Managed Care – PPO | Admitting: Family Medicine

## 2012-12-20 VITALS — BP 110/74 | HR 103 | Temp 98.1°F | Wt 202.0 lb

## 2012-12-20 DIAGNOSIS — M62838 Other muscle spasm: Secondary | ICD-10-CM | POA: Insufficient documentation

## 2012-12-20 DIAGNOSIS — M542 Cervicalgia: Secondary | ICD-10-CM

## 2012-12-20 NOTE — Telephone Encounter (Signed)
Will see today.  

## 2012-12-20 NOTE — Progress Notes (Signed)
  Subjective:    Patient ID: [name], male    DOB: 1975/06/16, 37 y.o.   MRN: [id number]  HPI CC: neck pain   Injured R neck on Friday - unsure how.  Rested Saturday.  Sunday started having worsening pain associated with neck spasms.  Heat helps.  Swallowing hurts but no sore throat. Denies fevers/chills, shooting pain down arms, numbness or weakness of arms.  No falls or injury to neck.    Pain may have started after specific entire body stretch to right  Flexeril stopped working.  zanaflex 4mg  nightly to help sleep. Regular with norco, aleve, and tramadol  Past Medical History  Diagnosis Date  . Back pain, chronic     Previous pain clinic eval and treatment  . H/O: substance abuse     sober for years as of 2011. Has been responsible with Rx meds, prev was on lexpapro but had done well for extened period of time off med as of 2013  . Family history of malignant neoplasm of gastrointestinal tract   . Ganglion of joint   . Insomnia, unspecified   . Chronic rhinitis   . Rash and other nonspecific skin eruption     Past Surgical History  Procedure Laterality Date  . Back fusion      2 level  lower back surgery - 2 level lumbar fusion.  Review of Systems Per HPI    Objective:   Physical Exam  Nursing note and vitals reviewed. Constitutional: He appears well-developed and well-nourished. No distress.  Neck: Normal range of motion. Neck supple. No tracheal deviation present. No thyromegaly present.  Musculoskeletal:  No midline neck pain Tenderness and tightness noted at right paracervical neck muscles and lateral neck, but no erythema, swelling or warmth of lateral neck. FROM at neck but pain with full extension/flexion of neck.  Lymphadenopathy:    He has no cervical adenopathy.       Assessment & Plan:

## 2012-12-20 NOTE — Assessment & Plan Note (Addendum)
Anticipate acute R cervical neck strain.  No red flags on history or exam. Treat with increased aleve to 2 tablets twice daily and add skelaxin during the day - hold zanaflex (samples of skelaxin provided). Provided with stretching exercises from [facility] pt advisor on neck spasm. Update [place] with effect of skelaxin, and update [place] sooner if not improving as expected.

## 2012-12-20 NOTE — Patient Instructions (Signed)
I think you have a neck strain/spasm. Treat with increasing aleve to 2 pills twice daily with food for next 3-5 days Continue heating pad to neck. May try skelaxin samples provided today (new muscle relaxant) Start stretching exercises for neck spasm. Let us know if not improving with this. Continue pain regimen as up to now.

## 2013-01-12 ENCOUNTER — Other Ambulatory Visit: Payer: Self-pay | Admitting: Family Medicine

## 2013-01-12 NOTE — Telephone Encounter (Signed)
Please call in.  Thanks.   

## 2013-01-12 NOTE — Telephone Encounter (Signed)
Electronic refill request.  Please advise. 

## 2013-01-12 NOTE — Telephone Encounter (Signed)
Medication phoned to pharmacy.  

## 2013-01-16 ENCOUNTER — Other Ambulatory Visit: Payer: Self-pay

## 2013-01-16 NOTE — Telephone Encounter (Signed)
Pt left v/m requesting rx for hydrocodone apap. Call when ready for pick up; pt request to pick up on 01/17/13.

## 2013-01-17 ENCOUNTER — Encounter: Payer: Self-pay | Admitting: Family Medicine

## 2013-01-17 MED ORDER — HYDROCODONE-ACETAMINOPHEN 10-325 MG PO TABS: ORAL_TABLET | ORAL | Status: DC

## 2013-01-17 NOTE — Telephone Encounter (Signed)
Patient notified that script is up front ready for pickup. 

## 2013-01-17 NOTE — Telephone Encounter (Signed)
Printed.  Thanks.  

## 2013-02-02 ENCOUNTER — Encounter: Payer: Self-pay | Admitting: Family Medicine

## 2013-02-15 ENCOUNTER — Other Ambulatory Visit: Payer: Self-pay | Admitting: Family Medicine

## 2013-02-16 MED ORDER — HYDROCODONE-ACETAMINOPHEN 10-325 MG PO TABS: ORAL_TABLET | ORAL | Status: DC

## 2013-02-16 NOTE — Telephone Encounter (Signed)
Pt requesting refill on hydrocodone.  Please call when ready for pick up and let pt know he must bring his ID with him.  Controlled substance contract signed 01/2013.

## 2013-02-16 NOTE — Telephone Encounter (Signed)
Printed

## 2013-02-16 NOTE — Telephone Encounter (Signed)
Electronic refill request.  Please advise. 

## 2013-02-16 NOTE — Telephone Encounter (Signed)
Patient advised.  Rx left at front desk for pick up. 

## 2013-03-16 ENCOUNTER — Other Ambulatory Visit: Payer: Self-pay

## 2013-03-16 MED ORDER — HYDROCODONE-ACETAMINOPHEN 10-325 MG PO TABS: ORAL_TABLET | ORAL | Status: DC

## 2013-03-16 MED ORDER — TRAMADOL HCL 50 MG PO TABS: ORAL_TABLET | ORAL | Status: DC

## 2013-03-16 NOTE — Telephone Encounter (Signed)
Printed and placed in Kim's box. 

## 2013-03-16 NOTE — Telephone Encounter (Signed)
Pt left v/m requesting rx for tramadol and hydrocodone apap. Call when ready for pick up.

## 2013-03-19 NOTE — Telephone Encounter (Signed)
Patient notified by telephone that script is up front and ready for pickup. 

## 2013-04-19 ENCOUNTER — Other Ambulatory Visit: Payer: Self-pay

## 2013-04-19 MED ORDER — HYDROCODONE-ACETAMINOPHEN 10-325 MG PO TABS: ORAL_TABLET | ORAL | Status: DC

## 2013-04-19 MED ORDER — TRAMADOL HCL 50 MG PO TABS
ORAL_TABLET | ORAL | Status: DC
Start: 2013-04-19 — End: 2013-05-17

## 2013-04-19 NOTE — Telephone Encounter (Signed)
Printed and placed in kim's box 

## 2013-04-19 NOTE — Telephone Encounter (Signed)
Pt left v/m requesting rx hydrocodone apap and tramadol. Pt request to pick up today. Call when ready for pick up.

## 2013-04-19 NOTE — Telephone Encounter (Signed)
Patient notified and Rx's placed up front for pick up. 

## 2013-05-17 ENCOUNTER — Other Ambulatory Visit: Payer: Self-pay

## 2013-05-17 MED ORDER — TIZANIDINE HCL 4 MG PO TABS: ORAL_TABLET | ORAL | Status: DC

## 2013-05-17 MED ORDER — HYDROCODONE-ACETAMINOPHEN 10-325 MG PO TABS: ORAL_TABLET | ORAL | Status: DC

## 2013-05-17 MED ORDER — TRAMADOL HCL 50 MG PO TABS: ORAL_TABLET | ORAL | Status: DC

## 2013-05-17 NOTE — Telephone Encounter (Signed)
Pt left v/m requesting rx hydrocodone apap, tizanidine and tramadol. Pt would like to pick up rx on 05/18/13.

## 2013-05-17 NOTE — Telephone Encounter (Signed)
Printed.  Thanks.  

## 2013-05-18 NOTE — Telephone Encounter (Signed)
Spoke with pt and let him know they were ready up front

## 2013-06-13 ENCOUNTER — Other Ambulatory Visit: Payer: Self-pay

## 2013-06-13 NOTE — Telephone Encounter (Signed)
Pt left v/m requesting refill hydrocodone apap and tramadol; pt will be out of med over weekend. Call when ready for pick up.

## 2013-06-14 MED ORDER — HYDROCODONE-ACETAMINOPHEN 10-325 MG PO TABS: ORAL_TABLET | ORAL | Status: DC

## 2013-06-14 MED ORDER — TRAMADOL HCL 50 MG PO TABS: ORAL_TABLET | ORAL | Status: DC

## 2013-06-14 NOTE — Telephone Encounter (Signed)
Patient advised.  Rx left at front desk for pick up. 

## 2013-06-14 NOTE — Telephone Encounter (Signed)
Printed

## 2013-07-12 ENCOUNTER — Other Ambulatory Visit: Payer: Self-pay

## 2013-07-12 MED ORDER — HYDROCODONE-ACETAMINOPHEN 10-325 MG PO TABS: ORAL_TABLET | ORAL | Status: DC

## 2013-07-12 MED ORDER — TRAMADOL HCL 50 MG PO TABS: ORAL_TABLET | ORAL | Status: DC

## 2013-07-12 MED ORDER — TIZANIDINE HCL 4 MG PO TABS: ORAL_TABLET | ORAL | Status: DC

## 2013-07-12 NOTE — Telephone Encounter (Signed)
Pt left v/m requesting rx hydrocodone apap, tizanidine and tramadol. Call when ready for pick up.

## 2013-07-12 NOTE — Telephone Encounter (Signed)
All printed.  Thanks.  

## 2013-07-13 NOTE — Telephone Encounter (Signed)
Patient advised.  Rx left at front desk for pick up. 

## 2013-08-09 ENCOUNTER — Other Ambulatory Visit: Payer: Self-pay

## 2013-08-09 NOTE — Telephone Encounter (Signed)
Pt left v/m requesting rx for hydrocodone apap, tizanidine and tramadol; call when ready for pick up. Pt request to pick up today if possible; pt also wants to know when should schedule next appt with Dr Para Marchuncan. Please advise.

## 2013-08-10 MED ORDER — TRAMADOL HCL 50 MG PO TABS: ORAL_TABLET | ORAL | Status: DC

## 2013-08-10 MED ORDER — HYDROCODONE-ACETAMINOPHEN 10-325 MG PO TABS: ORAL_TABLET | ORAL | Status: DC

## 2013-08-10 MED ORDER — TIZANIDINE HCL 4 MG PO TABS: ORAL_TABLET | ORAL | Status: DC

## 2013-08-10 NOTE — Telephone Encounter (Signed)
Printed.  Summer 2015 would be reasonable.  Thanks.

## 2013-08-10 NOTE — Telephone Encounter (Signed)
Patient advised.  Rx left at front desk for pick up. 

## 2013-09-11 ENCOUNTER — Other Ambulatory Visit: Payer: Self-pay

## 2013-09-11 MED ORDER — TRAMADOL HCL 50 MG PO TABS: ORAL_TABLET | ORAL | Status: DC

## 2013-09-11 MED ORDER — HYDROCODONE-ACETAMINOPHEN 10-325 MG PO TABS: ORAL_TABLET | ORAL | Status: DC

## 2013-09-11 NOTE — Telephone Encounter (Signed)
Printed. Thanks. Due for f/u this summer.  Thanks.

## 2013-09-11 NOTE — Telephone Encounter (Signed)
Left detailed message on voicemail. Rx left at front desk for pick up.  

## 2013-09-11 NOTE — Telephone Encounter (Signed)
Pt left v/m requesting rx for norco and tramadol. Call pt when ready for pick up. Pt also requested rx for tizanidine but spoke with Marcelino Duster at [facility] and pt has available refills on tizanidine. Pt will contact pharmacy for tizanidine refills.

## 2013-10-11 ENCOUNTER — Other Ambulatory Visit: Payer: Self-pay

## 2013-10-11 MED ORDER — TRAMADOL HCL 50 MG PO TABS: ORAL_TABLET | ORAL | Status: DC

## 2013-10-11 MED ORDER — HYDROCODONE-ACETAMINOPHEN 10-325 MG PO TABS: ORAL_TABLET | ORAL | Status: DC

## 2013-10-11 MED ORDER — TIZANIDINE HCL 4 MG PO TABS: ORAL_TABLET | ORAL | Status: DC

## 2013-10-11 NOTE — Telephone Encounter (Signed)
printed and palced in Kims' box

## 2013-10-11 NOTE — Telephone Encounter (Signed)
Pt left vm requesting printed rx for hydrocodone apap,tizanidine and tramadol. Call pt when ready for pick up. Pt will schedule appt when picks up rx.

## 2013-10-12 NOTE — Telephone Encounter (Signed)
Message left notifying patient and Rx's placed up front for pick up.  

## 2013-10-15 ENCOUNTER — Encounter (INDEPENDENT_AMBULATORY_CARE_PROVIDER_SITE_OTHER): Payer: Self-pay

## 2013-10-15 ENCOUNTER — Ambulatory Visit (INDEPENDENT_AMBULATORY_CARE_PROVIDER_SITE_OTHER): Payer: 59 | Admitting: Family Medicine

## 2013-10-15 ENCOUNTER — Encounter: Payer: Self-pay | Admitting: Family Medicine

## 2013-10-15 VITALS — BP 102/74 | HR 87 | Temp 97.5°F | Wt 208.0 lb

## 2013-10-15 DIAGNOSIS — M549 Dorsalgia, unspecified: Secondary | ICD-10-CM

## 2013-10-15 MED ORDER — LANSOPRAZOLE 30 MG PO CPDR: 30.0000 mg | DELAYED_RELEASE_CAPSULE | Freq: Every day | ORAL | Status: DC

## 2013-10-15 MED ORDER — HYDROCODONE-ACETAMINOPHEN 10-325 MG PO TABS: ORAL_TABLET | ORAL | Status: DC

## 2013-10-15 NOTE — Progress Notes (Signed)
Pre visit review using our clinic review tool, if applicable. No additional management support is needed unless otherwise documented below in the visit note.  More back pain recently. He had been on a stable med regimen for years.  No new trauma. He has been stretching.  His schedule has been altered recently and the weather has been variable recently.  He is driving to [place] in August, asking about options.  He's been picking up his kids, and they are getting bigger.  No radicular sx.  The pain is just lateral to midline, bilaterally.    Now with midline abd pain, superior to the umbilicus in the last 2 days.  Not burping. No vomiting, no diarrhea. On NSAID.  Hasn't been on PPI recently.  Drinks a lot of soda.    Meds, vitals, and allergies reviewed.   ROS: See HPI.  Otherwise, noncontributory.  nad ncat Mmm rrr ctab abd soft but slightly ttp in the epigastrum. No RUQ pain. No rebound. Normal BS. Ext w/o edema Back ttp just lateral to the spine bilaterally in the lower back.  No midline pain.

## 2013-10-15 NOTE — Patient Instructions (Signed)
Start back on prevacid and take an extra 1/2 tab of hydrocodone if needed in the meantime.  Keep stretching and give me an update in about 1-2 weeks, before your trip.  Glad to see you.

## 2013-10-16 NOTE — Assessment & Plan Note (Signed)
D/w pt.  Stable opiate dose for years.  This worsening pain may be from activity- picking up his kids, etc.  Would add on extra 1/2 tab of hydrocodone as needed for painful days and report back.  He'll using his existing rx, would likely run out early and I am aware of this. We can do new rx as needed, before his trip out of town.  The abd pain is likely from NSAIDS.  D/w pt about getting back on PPI for now and he'll report back. He does improve with NSAID, so I wouldn't hold it now.  He agrees.

## 2013-10-17 ENCOUNTER — Telehealth: Payer: Self-pay

## 2013-10-17 ENCOUNTER — Encounter (HOSPITAL_COMMUNITY): Payer: Self-pay | Admitting: Emergency Medicine

## 2013-10-17 ENCOUNTER — Emergency Department (HOSPITAL_COMMUNITY): Payer: 59

## 2013-10-17 ENCOUNTER — Emergency Department (HOSPITAL_COMMUNITY)
Admission: EM | Admit: 2013-10-17 | Discharge: 2013-10-18 | Disposition: A | Discharge status: Home / Self Care | Payer: 59 | Attending: Emergency Medicine | Admitting: Emergency Medicine

## 2013-10-17 DIAGNOSIS — Z8709 Personal history of other diseases of the respiratory system: Secondary | ICD-10-CM | POA: Insufficient documentation

## 2013-10-17 DIAGNOSIS — G8929 Other chronic pain: Secondary | ICD-10-CM | POA: Diagnosis not present

## 2013-10-17 DIAGNOSIS — Z791 Long term (current) use of non-steroidal anti-inflammatories (NSAID): Secondary | ICD-10-CM | POA: Diagnosis not present

## 2013-10-17 DIAGNOSIS — K259 Gastric ulcer, unspecified as acute or chronic, without hemorrhage or perforation: Secondary | ICD-10-CM

## 2013-10-17 DIAGNOSIS — Z8739 Personal history of other diseases of the musculoskeletal system and connective tissue: Secondary | ICD-10-CM | POA: Insufficient documentation

## 2013-10-17 DIAGNOSIS — Z79899 Other long term (current) drug therapy: Secondary | ICD-10-CM | POA: Diagnosis not present

## 2013-10-17 DIAGNOSIS — R109 Unspecified abdominal pain: Secondary | ICD-10-CM

## 2013-10-17 LAB — CBC WITH DIFFERENTIAL/PLATELET
BASOS ABS: 0 10*3/uL (ref 0.0–0.1)
Basophils Relative: 1 % (ref 0–1)
EOS ABS: 0.7 10*3/uL (ref 0.0–0.7)
Eosinophils Relative: 13 % — ABNORMAL HIGH (ref 0–5)
HCT: 40 % (ref 39.0–52.0)
Hemoglobin: 13.9 g/dL (ref 13.0–17.0)
LYMPHS PCT: 27 % (ref 12–46)
Lymphs Abs: 1.4 10*3/uL (ref 0.7–4.0)
MCH: 31.1 pg (ref 26.0–34.0)
MCHC: 34.8 g/dL (ref 30.0–36.0)
MCV: 89.5 fL (ref 78.0–100.0)
Monocytes Absolute: 0.3 10*3/uL (ref 0.1–1.0)
Monocytes Relative: 6 % (ref 3–12)
Neutro Abs: 2.8 10*3/uL (ref 1.7–7.7)
Neutrophils Relative %: 53 % (ref 43–77)
Platelets: 248 10*3/uL (ref 150–400)
RBC: 4.47 MIL/uL (ref 4.22–5.81)
RDW: 12.8 % (ref 11.5–15.5)
WBC: 5.3 10*3/uL (ref 4.0–10.5)

## 2013-10-17 LAB — POC OCCULT BLOOD, ED: Fecal Occult Bld: NEGATIVE

## 2013-10-17 LAB — COMPREHENSIVE METABOLIC PANEL
ALT: 30 U/L (ref 0–53)
AST: 28 U/L (ref 0–37)
Albumin: 4.2 g/dL (ref 3.5–5.2)
Alkaline Phosphatase: 65 U/L (ref 39–117)
Anion gap: 14 (ref 5–15)
BUN: 8 mg/dL (ref 6–23)
CALCIUM: 9.3 mg/dL (ref 8.4–10.5)
CO2: 27 mEq/L (ref 19–32)
Chloride: 100 mEq/L (ref 96–112)
Creatinine, Ser: 0.72 mg/dL (ref 0.50–1.35)
GFR calc Af Amer: >90 mL/min (ref >90)
GFR calc non Af Amer: >90 mL/min (ref >90)
Glucose, Bld: 89 mg/dL (ref 70–99)
Potassium: 4.3 mEq/L (ref 3.7–5.3)
SODIUM: 141 meq/L (ref 137–147)
Total Bilirubin: 0.3 mg/dL (ref 0.3–1.2)
Total Protein: 7.4 g/dL (ref 6.0–8.3)

## 2013-10-17 LAB — I-STAT TROPONIN, ED: TROPONIN I, POC: 0 ng/mL (ref 0.00–0.08)

## 2013-10-17 LAB — URINALYSIS, ROUTINE W REFLEX MICROSCOPIC
Bilirubin Urine: NEGATIVE
Glucose, UA: NEGATIVE mg/dL
Ketones, ur: NEGATIVE mg/dL
Leukocytes, UA: NEGATIVE
NITRITE: NEGATIVE
Protein, ur: NEGATIVE mg/dL
Specific Gravity, Urine: 1.011 (ref 1.005–1.030)
UROBILINOGEN UA: 0.2 mg/dL (ref 0.0–1.0)
pH: 6.5 (ref 5.0–8.0)

## 2013-10-17 LAB — LIPASE, BLOOD: Lipase: 22 U/L (ref 11–59)

## 2013-10-17 LAB — URINE MICROSCOPIC-ADD ON

## 2013-10-17 MED ORDER — ONDANSETRON HCL 4 MG/2ML IJ SOLN
4.0000 mg | Freq: Once | INTRAMUSCULAR | Status: AC
  Administered 2013-10-17: 4 mg via INTRAVENOUS
  Filled 2013-10-17: qty 2

## 2013-10-17 MED ORDER — IOHEXOL 300 MG/ML  SOLN
25.0000 mL | Freq: Once | INTRAMUSCULAR | Status: AC | PRN
  Administered 2013-10-17: 25 mL via ORAL

## 2013-10-17 MED ORDER — IOHEXOL 300 MG/ML  SOLN
100.0000 mL | Freq: Once | INTRAMUSCULAR | Status: AC | PRN
  Administered 2013-10-17: 100 mL via INTRAVENOUS

## 2013-10-17 MED ORDER — MORPHINE SULFATE 4 MG/ML IJ SOLN
4.0000 mg | Freq: Once | INTRAMUSCULAR | Status: AC
  Administered 2013-10-17: 4 mg via INTRAVENOUS
  Filled 2013-10-17: qty 1

## 2013-10-17 NOTE — ED Notes (Signed)
Pt placed in gown and in bed. Pt monitored by 12-lead, bp cuff, and pulse ox. 

## 2013-10-17 NOTE — Telephone Encounter (Signed)
Dr Para Marchuncan said to ask pt if Dr Para Marchuncan can talk with his wife and pt said yes it is OK for Dr Para Marchuncan to speak with his wife. [facility]

## 2013-10-17 NOTE — Telephone Encounter (Signed)
Pt's wife, Mardi Mainlandlissa called and is really concerned about pt's abdominal pain. She would like for you to call her back today, if possible. [phone]. Thank you.

## 2013-10-17 NOTE — ED Provider Notes (Addendum)
CSN: [id number]     Arrival date & time 10/17/13  1705 History   First MD Initiated Contact with Patient 10/17/13 2115     Chief Complaint  Patient presents with  . Abdominal Pain     (Consider location/radiation/quality/duration/timing/severity/associated sxs/prior Treatment) The history is provided by the patient. No language interpreter was used.  [name] is a 38 y/o M with PMHx of chronic back pain, substance abuse, insomnia, chronic rhinitis presenting to the ED with abdominal pain that has been ongoing since Sunday. Patient reported that he has been having abdominal pain localized to the periumbilical region described as if he was "punched in the stomach." Stated that the pain has been constant. Reported that he has been having nausea and intermittent emesis. Stated that he is unable to get comfortable. Stated that no matter what he does, position, food the pain is not alleviated. Reported that he has been drinking milk, taking Tums, using Prilosec with minimal relief. Stated that he started Pepcid yesterday with minimal relief. Reported that this afternoon he noticed that his stool was black. As per wife, reported that patient was seen and assessed by his PCP on Monday who started the patient on Prevacid, but patient was unable to afford the medication. As per patient, reported that he has been taking Aleve 2 tablets in the morning every morning since 2005. Denied chest pain, shortness of breath, difficulty breathing, dysuria, hematuria, fever, chills, bright red blood per rectum. PCP Dr. Para March   Past Medical History  Diagnosis Date  . Back pain, chronic     Previous pain clinic eval and treatment  . H/O: substance abuse     sober for years as of 2011. Has been responsible with Rx meds, prev was on lexpapro but had done well for extened period of time off med as of 2013  . Family history of malignant neoplasm of gastrointestinal tract   . Ganglion of joint   . Insomnia,  unspecified   . Chronic rhinitis   . Rash and other nonspecific skin eruption    Past Surgical History  Procedure Laterality Date  . Back fusion      2 level   Family History  Problem Relation Age of Onset  . Arthritis Father   . Hyperlipidemia Father   . Hypertension Father   . Atrial fibrillation Father   . Arthritis Mother   . Hyperlipidemia Mother   . Hypertension Mother   . Colon cancer      <60-grandparents  . Hypertension      grandparents  . Heart disease      grandparents  . Supraventricular tachycardia Sister    History  Substance Use Topics  . Smoking status: Never Smoker   . Smokeless tobacco: Never Used  . Alcohol Use: No    Review of Systems  Constitutional: Negative for fever and chills.  Respiratory: Negative for chest tightness and shortness of breath.   Cardiovascular: Negative for chest pain.  Gastrointestinal: Positive for nausea, vomiting, abdominal pain and blood in stool. Negative for diarrhea, constipation and anal bleeding.  Neurological: Negative for weakness.      Allergies  Review of patient's allergies indicates no known allergies.  Home Medications   Prior to Admission medications   Medication Sig Start Date End Date Taking? Authorizing Provider  b complex vitamins tablet Take 1 tablet by mouth daily.   Yes Historical Provider, MD  Flaxseed, Linseed, (FLAX SEED OIL) 1000 MG CAPS Take 1,000 capsules by mouth  daily.   Yes Historical Provider, MD  HYDROcodone-acetaminophen (NORCO) 10-325 MG per tablet Take 0.5 tablets by mouth 2 (two) times daily.   Yes Historical Provider, MD  Multiple Vitamin (MULTIVITAMIN) tablet Take 1 tablet by mouth daily.   Yes Historical Provider, MD  naproxen sodium (ANAPROX) 220 MG tablet Take 440 mg by mouth every morning.    Yes Historical Provider, MD  Omega-3 Fatty Acids (FISH OIL) 1000 MG CAPS Take 1,000 mg by mouth daily.    Yes Historical Provider, MD  omeprazole (PRILOSEC) 20 MG capsule Take 20 mg by  mouth daily.   Yes Historical Provider, MD  tiZANidine (ZANAFLEX) 4 MG tablet Take 4 mg by mouth at bedtime.   Yes Historical Provider, MD  traMADol (ULTRAM) 50 MG tablet Take 100 mg by mouth every 6 (six) hours.   Yes Historical Provider, MD  esomeprazole (NEXIUM) 20 MG capsule Take 1 capsule (20 mg total) by mouth daily. 10/18/13   Lucetta Baehr, PA-C   BP 126/79  Pulse 60  Temp(Src) 98.1 F (36.7 C) (Oral)  Resp 16  Ht 6' 2.5" (1.892 m)  Wt 208 lb (94.348 kg)  BMI 26.36 kg/m2  SpO2 94% Physical Exam  Nursing note and vitals reviewed. Constitutional: He is oriented to person, place, and time. He appears well-developed and well-nourished. No distress.  HENT:  Head: Normocephalic and atraumatic.  Mouth/Throat: Oropharynx is clear and moist. No oropharyngeal exudate.  Eyes: Conjunctivae and EOM are normal. Pupils are equal, round, and reactive to light. Right eye exhibits no discharge. Left eye exhibits no discharge.  Neck: Normal range of motion. Neck supple. No tracheal deviation present.  Cardiovascular: Normal rate, regular rhythm and normal heart sounds.  Exam reveals no friction rub.   No murmur heard. Pulses:      Radial pulses are 2+ on the right side, and 2+ on the left side.       Dorsalis pedis pulses are 2+ on the right side, and 2+ on the left side.       Posterior tibial pulses are 2+ on the right side, and 2+ on the left side.  Cap refill < 3 seconds  Negative swelling or pitting edema noted to the lower extremities bilaterally  Pulmonary/Chest: Effort normal and breath sounds normal. No respiratory distress. He has no wheezes. He has no rales.  Abdominal: Soft. Bowel sounds are normal. He exhibits no distension. There is tenderness in the right lower quadrant and periumbilical area. There is guarding (Voluntary ). There is no rebound.    Negative abdominal distention Bowel sounds normal active in all 4 quadrants Abdomen soft upon palpation Negative rigidity or  particular signs noted Discomfort upon palpation to the umbilical region as well as right lower quadrant  Genitourinary:  Rectal Exam: Negative swelling, erythema, inflammation, lesions, sores, deformities noted. Negative external hemorrhoids noted. Negative bright red blood per rectum. Negative palpation of masses on exam. Negative pain on exam. Negative bright red blood on glove. Brown stools noted on glove. Strong sphincter tone.  Exam chaperone with nurse.    Musculoskeletal: Normal range of motion. He exhibits no edema and no tenderness.  Full ROM to upper and lower extremities without difficulty noted, negative ataxia noted.  Lymphadenopathy:    He has no cervical adenopathy.  Neurological: He is alert and oriented to person, place, and time. No cranial nerve deficit. He exhibits normal muscle tone. Coordination normal.  Cranial nerves III-XII grossly intact Negative facial drooping Negative slurred speech  Negative  aphasia GCS 15   Skin: Skin is warm and dry. No rash noted. He is not diaphoretic. No erythema.  Psychiatric: He has a normal mood and affect. His behavior is normal. Thought content normal.    ED Course  Procedures (including critical care time)  Results for orders placed during the hospital encounter of 10/17/13  CBC WITH DIFFERENTIAL      Result Value Ref Range   WBC 5.3  4.0 - 10.5 K/uL   RBC 4.47  4.22 - 5.81 MIL/uL   Hemoglobin 13.9  13.0 - 17.0 g/dL   HCT 16.140.0  09.639.0 - 04.552.0 %   MCV 89.5  78.0 - 100.0 fL   MCH 31.1  26.0 - 34.0 pg   MCHC 34.8  30.0 - 36.0 g/dL   RDW 40.912.8  81.111.5 - 91.415.5 %   Platelets 248  150 - 400 K/uL   Neutrophils Relative % 53  43 - 77 %   Neutro Abs 2.8  1.7 - 7.7 K/uL   Lymphocytes Relative 27  12 - 46 %   Lymphs Abs 1.4  0.7 - 4.0 K/uL   Monocytes Relative 6  3 - 12 %   Monocytes Absolute 0.3  0.1 - 1.0 K/uL   Eosinophils Relative 13 (*) 0 - 5 %   Eosinophils Absolute 0.7  0.0 - 0.7 K/uL   Basophils Relative 1  0 - 1 %   Basophils  Absolute 0.0  0.0 - 0.1 K/uL  COMPREHENSIVE METABOLIC PANEL      Result Value Ref Range   Sodium 141  137 - 147 mEq/L   Potassium 4.3  3.7 - 5.3 mEq/L   Chloride 100  96 - 112 mEq/L   CO2 27  19 - 32 mEq/L   Glucose, Bld 89  70 - 99 mg/dL   BUN 8  6 - 23 mg/dL   Creatinine, Ser 7.820.72  0.50 - 1.35 mg/dL   Calcium 9.3  8.4 - 95.610.5 mg/dL   Total Protein 7.4  6.0 - 8.3 g/dL   Albumin 4.2  3.5 - 5.2 g/dL   AST 28  0 - 37 U/L   ALT 30  0 - 53 U/L   Alkaline Phosphatase 65  39 - 117 U/L   Total Bilirubin 0.3  0.3 - 1.2 mg/dL   GFR calc non Af Amer >90  >90 mL/min   GFR calc Af Amer >90  >90 mL/min   Anion gap 14  5 - 15  LIPASE, BLOOD      Result Value Ref Range   Lipase 22  11 - 59 U/L  URINALYSIS, ROUTINE W REFLEX MICROSCOPIC      Result Value Ref Range   Color, Urine YELLOW  YELLOW   APPearance CLEAR  CLEAR   Specific Gravity, Urine 1.011  1.005 - 1.030   pH 6.5  5.0 - 8.0   Glucose, UA NEGATIVE  NEGATIVE mg/dL   Hgb urine dipstick TRACE (*) NEGATIVE   Bilirubin Urine NEGATIVE  NEGATIVE   Ketones, ur NEGATIVE  NEGATIVE mg/dL   Protein, ur NEGATIVE  NEGATIVE mg/dL   Urobilinogen, UA 0.2  0.0 - 1.0 mg/dL   Nitrite NEGATIVE  NEGATIVE   Leukocytes, UA NEGATIVE  NEGATIVE  URINE MICROSCOPIC-ADD ON      Result Value Ref Range   Squamous Epithelial / LPF RARE  RARE   RBC / HPF 0-2  <3 RBC/hpf  I-STAT TROPOININ, ED      Result Value Ref  Range   Troponin i, poc 0.00  0.00 - 0.08 ng/mL   Comment 3           POC OCCULT BLOOD, ED      Result Value Ref Range   Fecal Occult Bld NEGATIVE  NEGATIVE    Labs Review Labs Reviewed  CBC WITH DIFFERENTIAL - Abnormal; Notable for the following:    Eosinophils Relative 13 (*)    All other components within normal limits  URINALYSIS, ROUTINE W REFLEX MICROSCOPIC - Abnormal; Notable for the following:    Hgb urine dipstick TRACE (*)    All other components within normal limits  COMPREHENSIVE METABOLIC PANEL  LIPASE, BLOOD  URINE  MICROSCOPIC-ADD ON  I-STAT TROPOININ, ED  POC OCCULT BLOOD, ED    Imaging Review Ct Abdomen Pelvis W Contrast  10/18/2013   CLINICAL DATA:  Periumbilical pain  EXAM: CT ABDOMEN AND PELVIS WITH CONTRAST  TECHNIQUE: Multidetector CT imaging of the abdomen and pelvis was performed using the standard protocol following bolus administration of intravenous contrast. Oral contrast was also administered.  CONTRAST:  OMNIPAQUE IOHEXOL 300 MG/ML  SOLN  COMPARISON:  December 17, 2005  FINDINGS: Lung bases are clear.  No focal liver lesions are identified. There is no biliary duct dilatation. Gallbladder wall is not thickened.  Spleen, pancreas, and adrenals appear normal. There is no renal mass or hydronephrosis on either side. There is no ureteral calculus on either side. Contrast within the kidneys could mass small intrarenal calculi.  In the pelvis, the urinary bladder is midline with normal wall thickness. There is no pelvic mass or fluid collection. Rectum is mildly distended with air.  Appendix appears normal.  Terminal ileum appears normal.  There is no bowel obstruction. No free air or portal venous air. There is no ascites, adenopathy, or abscess in the abdomen or pelvis. There is no abdominal aortic aneurysm. There is postoperative change in the lower lumbar spine. There are no blastic or lytic bone lesions. There is no lesion identified in the region of the umbilicus.  IMPRESSION: Postoperative change in the lumbar spine.  1 no bowel obstruction or abscess. Appendix appears normal. No hydronephrosis. No umbilical region lesion.   Electronically Signed   By: Bretta Bang M.D.   On: 10/18/2013 00:32     EKG Interpretation None      MDM   Final diagnoses:  Gastric ulcer, unspecified chronicity  Abdominal pain, unspecified abdominal location    Medications  morphine 4 MG/ML injection 4 mg (4 mg Intravenous Given 10/17/13 2259)  ondansetron (ZOFRAN) injection 4 mg (4 mg Intravenous  Given 10/17/13 2259)  iohexol (OMNIPAQUE) 300 MG/ML solution 25 mL (25 mLs Oral Contrast Given 10/17/13 2308)  iohexol (OMNIPAQUE) 300 MG/ML solution 100 mL (100 mLs Intravenous Contrast Given 10/17/13 2328)  morphine 4 MG/ML injection 4 mg (4 mg Intravenous Given 10/18/13 0039)  pantoprazole (PROTONIX) injection 40 mg (40 mg Intravenous Given 10/18/13 0137)   Filed Vitals:   10/18/13 0015 10/18/13 0100 10/18/13 0130 10/18/13 0200  BP: 121/78 124/77 127/77 126/79  Pulse: 60 66 60   Temp:      TempSrc:      Resp: 15 20 16    Height:      Weight:      SpO2: 95% 99% 94%     EKG noted normal sinus rhythm with a heart rate of 77 bpm. I-STAT troponin negative elevation. CBC negative elevated white blood cell count-negative left shift or leukocytosis noted. Hemoglobin  13.9, hematocrit 40.0-negative drop noted. CMP negative findings-BUN 8, creatinine 2.72. Negative elevated AST, ALT, alkaline phosphatase of bilirubin. Glucose 89, anion gap of 14.0 mEq per liter. Lipase negative elevation. Urinalysis noted trace of hemoglobin with negative nitrites or leukocytes identified. Fecal occult negative. CT abdomen pelvis with contrast noted postoperative changes in the lumbar spine. No bowel structure or abscess noted. Appendix appears normal. No hydronephrosis. No umbilical region lesion. Doubt pancreatitis. Doubt appendicitis. Doubt colitis. Doubt diverticulitis. Doubt abdominal abscess. Doubt acute processes. Suspicion to be gastric ulcer upon interview the patient. Patient given IV fluids, pain medications and protonix while in ED setting. Patient tolerated by mouth fluids without difficulty-carbonated drinks increased pain more so that water. Negative episodes of emesis while in ED setting. Suspicion of gastric ulcer secondary to chronic NSAID use over the past 10 years. Hematuria unknown - discussed with patient that repeat UA will need to be performed. Patient stable, afebrile. Patient not septic appearing.  Discharged patient. Discharged patient with PPIs. Discussed with patient proper diet. Referred patient to PCP and GI. Discussed with patient to take medications as prescribed. Discussed with patient to closely monitor symptoms and if symptoms are to worsen or change to report back to the ED - strict return instructions given.  Patient agreed to plan of care, understood, all questions answered.   Raymon Mutton, PA-C 10/18/13 0259  Raymon Mutton, PA-C 10/18/13 1513

## 2013-10-17 NOTE — ED Notes (Signed)
C/o mid upper abd pain since Sunday with mild nausea yesterday and today.  Pain worse with palpation and movement.  Seen by PCP on Monday and started taking Prilosec with no relief.  Reports vomited x 1 on Saturday.  No known fever at home but reports chills.

## 2013-10-17 NOTE — Telephone Encounter (Signed)
Talked to his wife.   This patient has a high pain tolerance and I have no reason to suspect that this is pain med seeking.  I think that his abd pain has gotten worse and he needs recheck.  [place]  He is at home.  I would get him seen at UC/ER tonight, UC would likely be preferred, since my notes would be available.  [place] given the current time and turn around time on labs.   He may have worsening gastritis in the meantime, given his NSAID use.   I will await to hear from events tonight before working on the PA for the PPI.  Wife agrees.  App help of all involved.

## 2013-10-17 NOTE — Telephone Encounter (Signed)
Pt left v/m requesting cb for status of Prior auth prevacid; [facility] for prevacid; lansoprozole or prevacid is not covered under pts ins; insurance prefers omeprazole.

## 2013-10-17 NOTE — Telephone Encounter (Signed)
Pt left another v/m; pt was seen on 10/15/13 and abdominal pain is worse today. Pt started Prilosec on 10/16/13 but is not effective. Abdominal pain is constant and worse today pain level now is 7.abdominal pain is center of upper stomach like someone punched him in the stomach; stomach feels hard to touch. Pt having normal BM; no N&V and no fever. Eating of different foods does not affect pain. Pt does drink 5-7 cans of soda a day.[facility].

## 2013-10-18 ENCOUNTER — Telehealth: Payer: Self-pay | Admitting: Family Medicine

## 2013-10-18 ENCOUNTER — Telehealth: Payer: Self-pay | Admitting: *Deleted

## 2013-10-18 DIAGNOSIS — R1013 Epigastric pain: Secondary | ICD-10-CM

## 2013-10-18 DIAGNOSIS — G8929 Other chronic pain: Secondary | ICD-10-CM

## 2013-10-18 MED ORDER — ESOMEPRAZOLE MAGNESIUM 20 MG PO CPDR: 20.0000 mg | DELAYED_RELEASE_CAPSULE | Freq: Every day | ORAL | Status: DC

## 2013-10-18 MED ORDER — PANTOPRAZOLE SODIUM 40 MG IV SOLR
40.0000 mg | Freq: Once | INTRAVENOUS | Status: AC
  Administered 2013-10-18: 40 mg via INTRAVENOUS
  Filled 2013-10-18: qty 40

## 2013-10-18 MED ORDER — MORPHINE SULFATE 4 MG/ML IJ SOLN
4.0000 mg | Freq: Once | INTRAMUSCULAR | Status: AC
  Administered 2013-10-18: 4 mg via INTRAVENOUS
  Filled 2013-10-18: qty 1

## 2013-10-18 NOTE — Addendum Note (Signed)
Addended by: Joaquim NamUNCAN, GRAHAM S on: 10/18/2013 10:35 PM   Modules accepted: Orders

## 2013-10-18 NOTE — Telephone Encounter (Signed)
Wife phoned in saying that the ER MD recommended that the patient see a GI specialist.  Wife and patient would like for him to see [facility] (wife works there).  [phone].  Please advise.

## 2013-10-18 NOTE — Telephone Encounter (Signed)
Noted, thanks!

## 2013-10-18 NOTE — Telephone Encounter (Signed)
Wife phoned this morning and says when they went to the ER last evening, the ER MD recommended Nexium, saying that he didn't think the Omeprazole was doing the job.  Patient does need PA for Nexium.  Laurette SchimkeLugene is working on the [place].

## 2013-10-18 NOTE — Discharge Instructions (Signed)
Please call your doctor for a followup appointment within 24-48 hours. When you talk to your doctor please let them know that you were seen in the emergency department and have them acquire all of your records so that they can discuss the findings with you and formulate a treatment plan to fully care for your new and ongoing problems. Please call and set-up an appointment with your primary care provider to be seen and re-assessed Please call and set-up an appointment with Gastroenterology - have to rule out ulcer versus gastritis Please rest and stay hydrated  Please avoid foods that are high in fats, grease, carbonated drinks, coffee, spicy foods for this can lead to worsening symptoms Please stop taking anti-inflammatories such as ibuprofen, Motrin, Aleve for this can lead to worsening of symptoms. Please drink plenty of water Please take medications as prescribed Please have repeat urinalysis performed by the end of this week identified there is any more blood in the urine if so microscopic evaluation will be needed Please continue to monitor symptoms closely and if symptoms are to worsen or change (fever greater than 101, chills, sweating, nausea, vomiting, chest pain, shortness of breath, difficulty breathing, numbness, tingling, worsening or changes to pain pattern, blood in the stools, black tarry stools, weakness, coughing up or vomiting up blood) please report back to the ED immediately   Peptic Ulcer A peptic ulcer is a sore in the lining of in your esophagus (esophageal ulcer), stomach (gastric ulcer), or in the first part of your small intestine (duodenal ulcer). The ulcer causes erosion into the deeper tissue. CAUSES  Normally, the lining of the stomach and the small intestine protects itself from the acid that digests food. The protective lining can be damaged by:  An infection caused by a bacterium called Helicobacter pylori (H. pylori).  Regular use of nonsteroidal anti-inflammatory  drugs (NSAIDs), such as ibuprofen or aspirin.  Smoking tobacco. Other risk factors include being older than 50, drinking alcohol excessively, and having a family history of ulcer disease.  SYMPTOMS   Burning pain or gnawing in the area between the chest and the belly button.  Heartburn.  Nausea and vomiting.  Bloating. The pain can be worse on an empty stomach and at night. If the ulcer results in bleeding, it can cause:  Black, tarry stools.  Vomiting of bright red blood.  Vomiting of coffee ground looking materials. DIAGNOSIS  A diagnosis is usually made based upon your history and an exam. Other tests and procedures may be performed to find the cause of the ulcer. Finding a cause will help determine the best treatment. Tests and procedures may include:  Blood tests, stool tests, or breath tests to check for the bacterium H. pylori.  An upper gastrointestinal (GI) series of the esophagus, stomach, and small intestine.  An endoscopy to examine the esophagus, stomach, and small intestine.  A biopsy. TREATMENT  Treatment may include:  Eliminating the cause of the ulcer, such as smoking, NSAIDs, or alcohol.  Medicines to reduce the amount of acid in your digestive tract.  Antibiotic medicines if the ulcer is caused by the H. pylori bacterium.  An upper endoscopy to treat a bleeding ulcer.  Surgery if the bleeding is severe or if the ulcer created a hole somewhere in the digestive system. HOME CARE INSTRUCTIONS   Avoid tobacco, alcohol, and caffeine. Smoking can increase the acid in the stomach, and continued smoking will impair the healing of ulcers.  Avoid foods and drinks that seem  to cause discomfort or aggravate your ulcer.  Only take medicines as directed by your caregiver. Do not substitute over-the-counter medicines for prescription medicines without talking to your caregiver.  Keep any follow-up appointments and tests as directed. SEEK MEDICAL CARE IF:    Your do not improve within 7 days of starting treatment.  You have ongoing indigestion or heartburn. SEEK IMMEDIATE MEDICAL CARE IF:   You have sudden, sharp, or persistent abdominal pain.  You have bloody or dark black, tarry stools.  You vomit blood or vomit that looks like coffee grounds.  You become light headed, weak, or feel faint.  You become sweaty or clammy. MAKE SURE YOU:   Understand these instructions.  Will watch your condition.  Will get help right away if you are not doing well or get worse. Document Released: 03/19/2000 Document Revised: 12/15/2011 Document Reviewed: 10/20/2011 [facility] Patient Information 2015 [employer] [place]. This information is not intended to replace advice given to you by your health care provider. Make sure you discuss any questions you have with your health care provider.  Food Choices for Peptic Ulcer Disease When you have peptic ulcer disease, the foods you eat and your eating habits are very important. Choosing the right foods can help ease the discomfort of peptic ulcer disease. WHAT GENERAL GUIDELINES DO I NEED TO FOLLOW?  Choose fruits, vegetables, whole grains, and low-fat meat, fish, and poultry.   Keep a food diary to identify foods that cause symptoms.  Avoid foods that cause irritation or pain. These may be different for different people.  Eat frequent small meals instead of three large meals each day. The pain may be worse when your stomach is empty.  Avoid eating close to bedtime. WHAT FOODS ARE NOT RECOMMENDED? The following are some foods and drinks that may worsen your symptoms:  Black, white, and red pepper.  Hot sauce.  Chili peppers.  Chili powder.  Chocolate and cocoa.   Alcohol.  Tea, coffee, and cola (regular and decaffeinated). The items listed above may not be a complete list of foods and beverages to avoid. Contact your dietitian for more information. Document Released: 06/14/2011 Document  Revised: 03/27/2013 Document Reviewed: 01/24/2013 [facility] Patient Information 2015 [place] [place]. This information is not intended to replace advice given to you by your health care provider. Make sure you discuss any questions you have with your health care provider.

## 2013-10-18 NOTE — ED Provider Notes (Signed)
Medical screening examination/treatment/procedure(s) were performed by non-physician practitioner and as supervising physician I was immediately available for consultation/collaboration.   EKG Interpretation None        Laray AngerKathleen M Alechia Lezama, DO 10/18/13 1431

## 2013-10-18 NOTE — Telephone Encounter (Signed)
Message copied by Joaquim NamUNCAN, Elohim Brune S on Thu Oct 18, 2013 10:47 PM ------      Message from: Annamarie MajorFUQUAY, LUGENE S      Created: Thu Oct 18, 2013  9:22 AM       Patient says he was dx with an ulcer and given something like Nexium IV and a Rx for pain and Nexium.  He is still hurting but not doubled over in pain like yesterday.  His wife is getting the Rx's filled now.  He thanks both of us and will let us know if he needs anything.      ----- Message -----         From: Joaquim NamGraham S Jenaye Rickert, MD         Sent: 10/18/2013   5:51 AM           To: Annamarie MajorLugene S Fuquay, CMA            Please call and get update on patient.  I saw the ER note.  Let me know if we need to do the PA on the PPI.  Thanks.         ------

## 2013-10-18 NOTE — Telephone Encounter (Signed)
Placed referral.  Thanks.

## 2013-10-18 NOTE — ED Provider Notes (Signed)
Medical screening examination/treatment/procedure(s) were performed by non-physician practitioner and as supervising physician I was immediately available for consultation/collaboration.   EKG Interpretation None        Laray AngerKathleen M Raevyn Sokol, DO 10/18/13 1558

## 2013-10-19 ENCOUNTER — Telehealth: Payer: Self-pay | Admitting: Family Medicine

## 2013-10-19 ENCOUNTER — Ambulatory Visit: Payer: 59 | Admitting: Family Medicine

## 2013-10-19 NOTE — Telephone Encounter (Signed)
Spoke with [name].  She will look into it right away.

## 2013-10-19 NOTE — Telephone Encounter (Signed)
Pt called and says [facility] was [facility]  Tuesday night.  Pt has an endoscopy scheduled for today at 3:30 w/[facility] and is calling to ask for a referral. Can you advise? Thank you.

## 2013-10-19 NOTE — Telephone Encounter (Signed)
I put in the referral prev.  Please ask Shirlee LimerickMarion about this.  Thanks.

## 2013-10-23 ENCOUNTER — Telehealth: Payer: Self-pay | Admitting: *Deleted

## 2013-10-23 NOTE — Telephone Encounter (Signed)
I phoned this patient to see if he had heard from his insurance company concerning the PA request for Nexium.  Patient states that the GI doctor that did his endoscopy prescribed something else because his insurance would not cover the Nexium.  Therefore, he is no longer pursuing the Nexium.

## 2013-11-01 ENCOUNTER — Other Ambulatory Visit: Payer: Self-pay | Admitting: *Deleted

## 2013-11-01 NOTE — Telephone Encounter (Signed)
Pt left voicemail at Triage:  Pt said at his appt on 10/15/13 you advise pt to increase norco to 1.5 tabs qd. Pt did that and he has went through his 30 tabs that you prescribed last time, pt is requesting a new Rx for norco with the new directions of taking 1.5 tabs qd, pt request call back when Rx ready for pick-up

## 2013-11-02 MED ORDER — HYDROCODONE-ACETAMINOPHEN 10-325 MG PO TABS: ORAL_TABLET | ORAL | Status: DC

## 2013-11-02 NOTE — Telephone Encounter (Signed)
Left detailed message on voicemail. Rx left at front desk for pick up.  

## 2013-11-02 NOTE — Telephone Encounter (Signed)
Printed.  Thanks.  

## 2013-11-06 ENCOUNTER — Other Ambulatory Visit: Payer: Self-pay

## 2013-11-06 NOTE — Telephone Encounter (Signed)
Pt left v/m; pt is leaving 11/09/13 for [place]; pt said refills on tramadol and tizanidine are not due for refills until 11/13/13. Pt request to fill tramadol and tizanidine early due to going out of country. Pt request cb.

## 2013-11-07 MED ORDER — TIZANIDINE HCL 4 MG PO TABS: 4.0000 mg | ORAL_TABLET | Freq: Every day | ORAL | Status: DC

## 2013-11-07 MED ORDER — TRAMADOL HCL 50 MG PO TABS: 100.0000 mg | ORAL_TABLET | Freq: Four times a day (QID) | ORAL | Status: DC

## 2013-11-07 NOTE — Telephone Encounter (Signed)
Spoke with patient and advised results rx called into pharmacy  

## 2013-11-07 NOTE — Telephone Encounter (Signed)
tizanidine sent, please call in the tramadol.  Please notify pharmacy that patient is leaving county and needed the refill early.  Thanks.

## 2013-12-07 ENCOUNTER — Other Ambulatory Visit: Payer: Self-pay

## 2013-12-07 MED ORDER — TRAMADOL HCL 50 MG PO TABS: 100.0000 mg | ORAL_TABLET | Freq: Four times a day (QID) | ORAL | Status: DC

## 2013-12-07 MED ORDER — HYDROCODONE-ACETAMINOPHEN 10-325 MG PO TABS: ORAL_TABLET | ORAL | Status: DC

## 2013-12-07 MED ORDER — TIZANIDINE HCL 4 MG PO TABS: 4.0000 mg | ORAL_TABLET | Freq: Every day | ORAL | Status: DC

## 2013-12-07 NOTE — Telephone Encounter (Signed)
Pt left v/m requesting rx for hydrocodone apap, tizanidine and tramadol; call when ready for pick up. Pt forgot our office would be closed on Mon 12/10/13 and pt will be out of med on 12/09/13.pt request to pick up rx today if possible. Please advise.

## 2013-12-07 NOTE — Telephone Encounter (Signed)
Patient advised.  Rx left at front desk for pick up. 

## 2013-12-07 NOTE — Telephone Encounter (Signed)
Printed.  Thanks.  

## 2014-01-04 ENCOUNTER — Other Ambulatory Visit: Payer: Self-pay

## 2014-01-04 MED ORDER — TRAMADOL HCL 50 MG PO TABS: 100.0000 mg | ORAL_TABLET | Freq: Four times a day (QID) | ORAL | Status: DC

## 2014-01-04 MED ORDER — HYDROCODONE-ACETAMINOPHEN 10-325 MG PO TABS: ORAL_TABLET | ORAL | Status: DC

## 2014-01-04 NOTE — Telephone Encounter (Signed)
Pt left v/m requesting rx hydrocodone apap and tramadol. Call when ready for pick up. Pt will be out of med on 01/06/14.

## 2014-01-04 NOTE — Telephone Encounter (Signed)
Printed.  Thanks.  

## 2014-01-06 ENCOUNTER — Other Ambulatory Visit: Payer: Self-pay | Admitting: Family Medicine

## 2014-01-07 NOTE — Telephone Encounter (Signed)
Received refill request electronically from pharmacy. Last refill 12/07/13 #30, last office visit 10/15/13. Is it okay to refill medication?

## 2014-01-07 NOTE — Telephone Encounter (Signed)
Patient notified that script is up front ready for pickup. 

## 2014-01-08 NOTE — Telephone Encounter (Signed)
Sent. Thanks.   

## 2014-02-04 ENCOUNTER — Other Ambulatory Visit: Payer: Self-pay

## 2014-02-04 MED ORDER — TIZANIDINE HCL 4 MG PO TABS: ORAL_TABLET | ORAL | Status: DC

## 2014-02-04 MED ORDER — TRAMADOL HCL 50 MG PO TABS: 100.0000 mg | ORAL_TABLET | Freq: Four times a day (QID) | ORAL | Status: DC

## 2014-02-04 MED ORDER — HYDROCODONE-ACETAMINOPHEN 10-325 MG PO TABS: ORAL_TABLET | ORAL | Status: DC

## 2014-02-04 NOTE — Telephone Encounter (Signed)
Pt left v/m requesting rx for hydrocodone apap, tramadol and tizanidine. Call when ready for pick up. Out of med on 02/06/14.

## 2014-02-04 NOTE — Telephone Encounter (Signed)
All printed.  Thanks.

## 2014-02-05 NOTE — Telephone Encounter (Signed)
Patient notified by telephone that scripts are up front ready for pickup. 

## 2014-03-06 ENCOUNTER — Other Ambulatory Visit: Payer: Self-pay

## 2014-03-06 MED ORDER — TIZANIDINE HCL 4 MG PO TABS: ORAL_TABLET | ORAL | Status: DC

## 2014-03-06 MED ORDER — TRAMADOL HCL 50 MG PO TABS: 100.0000 mg | ORAL_TABLET | Freq: Four times a day (QID) | ORAL | Status: DC

## 2014-03-06 MED ORDER — HYDROCODONE-ACETAMINOPHEN 10-325 MG PO TABS: ORAL_TABLET | ORAL | Status: DC

## 2014-03-06 NOTE — Telephone Encounter (Signed)
Pt left v/m requesting rxs for hydrocodone apap,tizanidine and tramadol. Call when ready for pick up.

## 2014-03-06 NOTE — Telephone Encounter (Signed)
Printed.  Thanks.  

## 2014-03-07 NOTE — Telephone Encounter (Signed)
Spoke to pt and informed him Rx is available for pickup at the front desk. Pt advised third party unable to pickup

## 2014-03-08 ENCOUNTER — Encounter: Payer: Self-pay | Admitting: Family Medicine

## 2014-03-13 ENCOUNTER — Telehealth: Payer: Self-pay | Admitting: Family Medicine

## 2014-03-13 NOTE — Telephone Encounter (Signed)
Please call pt.    He was asking about his mother- she is reportedly moving to the area in the near future and has multiple medical problems. He was asking if we had a Child psychotherapistsocial worker at the clinic. I told him we didn't, but I would check on this anyway.   [facility].  She does need to be an established patient first. Then we would likely be able to [facility].   That's all I know of from our end.  I'm not stating that she has to come to this clinic.  If he finds another option that is more convenient, then I'm fine with that.  I don't really prefer where she gets help, I just prefer that she gets help in the first place.    Thanks.

## 2014-03-13 NOTE — Telephone Encounter (Signed)
Patient notified as instructed by telephone. Patient verbalized understanding. Patient stated that he really did appreciate the information.

## 2014-03-22 ENCOUNTER — Encounter: Payer: Self-pay | Admitting: Family Medicine

## 2014-04-03 ENCOUNTER — Other Ambulatory Visit: Payer: Self-pay

## 2014-04-03 MED ORDER — TRAMADOL HCL 50 MG PO TABS: 100.0000 mg | ORAL_TABLET | Freq: Four times a day (QID) | ORAL | Status: DC

## 2014-04-03 MED ORDER — TIZANIDINE HCL 4 MG PO TABS: ORAL_TABLET | ORAL | Status: DC

## 2014-04-03 MED ORDER — HYDROCODONE-ACETAMINOPHEN 10-325 MG PO TABS: ORAL_TABLET | ORAL | Status: DC

## 2014-04-03 NOTE — Telephone Encounter (Signed)
Printed.  Thanks.  

## 2014-04-03 NOTE — Telephone Encounter (Signed)
Pt left v/m requesting rx hydrocodone apap, tizanidine and tramadol; pt needs to pick up rxs prior to holiday. Pt request cb when ready for pick up.Pt said does not need filled until Sat 04/06/14.

## 2014-04-03 NOTE — Telephone Encounter (Signed)
Patient notified that script is up front ready for pickup. 

## 2014-05-06 ENCOUNTER — Other Ambulatory Visit: Payer: Self-pay | Admitting: *Deleted

## 2014-05-06 MED ORDER — TIZANIDINE HCL 4 MG PO TABS: ORAL_TABLET | ORAL | Status: DC

## 2014-05-06 MED ORDER — HYDROCODONE-ACETAMINOPHEN 10-325 MG PO TABS: ORAL_TABLET | ORAL | Status: DC

## 2014-05-06 MED ORDER — TRAMADOL HCL 50 MG PO TABS: 100.0000 mg | ORAL_TABLET | Freq: Four times a day (QID) | ORAL | Status: DC

## 2014-05-06 NOTE — Telephone Encounter (Signed)
All printed.  Thanks.  

## 2014-05-06 NOTE — Telephone Encounter (Signed)
Patient left a voice mail to get refills. Last refill on Norco #45, Zanaflex #30, Tramadol #240, last refilled on 04/03/14. Last office visit 10/15/13. Patient requested a call back when scripts are ready for pickup.

## 2014-05-06 NOTE — Telephone Encounter (Signed)
Patient advised.  Rx left at front desk for pick up. 

## 2014-05-09 ENCOUNTER — Telehealth: Payer: Self-pay | Admitting: Family Medicine

## 2014-05-09 NOTE — Telephone Encounter (Signed)
Will see tomorrow

## 2014-05-09 NOTE — Telephone Encounter (Signed)
PLEASE NOTE: All timestamps contained within this report are represented as [place] Standard Time. CONFIDENTIALTY NOTICE: This fax transmission is intended only for the addressee. It contains information that is legally privileged, confidential or otherwise protected from use or disclosure. If you are not the intended recipient, you are strictly prohibited from reviewing, disclosing, copying using or disseminating any of this information or taking any action in reliance on or regarding this information. If you have received this fax in error, please notify us immediately by telephone so that we can arrange for its return to us. Phone: [phone], Toll-Free: [phone], Fax: [phone] Page: 1 of 2 Call Id: [id number] [facility] Client TELEPHONE ADVICE RECORD [facility] Patient Name: [name] Gender: Male DOB: [birth year] Age: [age] Y 6 M 24 D Return Phone Number: [phone] (Primary) Address: [address] City[place]State/Zip: [place] [address] Client [facility] Site  [facility] Physician Raechel Acheuncan, Shaw Contact Type Call Call Type Triage / Clinical Relationship To Patient Self Appointment Disposition EMR Appointment Scheduled Return Phone Number [phone] (Primary) Chief Complaint Flank Pain Initial Comment Caller states he is having nausea and some side/ back pain. PreDisposition Call a family member Info pasted into Epic Yes Nurse Assessment Nurse: Ladona RidgelGaddy, RN, Felicia Date/Time (Eastern Time): 05/09/2014 3:44:41 PM Confirm and document reason for call. If symptomatic, describe symptoms. ---Pt has mild nausea that has gone. Level 6-7 Pain when moving in a certain way - mild when sitting -- in side of back below lower rib on the R side onset yesterday when he woke. No fever Has the patient traveled out of the country within the last 30 days? ---No Does the  patient require triage? ---Yes Related visit to physician within the last 2 weeks? ---No Does the PT have any chronic conditions? (i.e. diabetes, asthma, etc.) ---No Guidelines Guideline Title Affirmed Question Affirmed Notes Nurse Date/Time Lamount Cohen(Eastern Time) Flank Pain MODERATE pain (e.g., interferes with normal activities or awakens from sleep) Ladona RidgelGaddy, RN, Sunny SchleinFelicia 05/09/2014 3:48:02 PM Disp. Time Lamount Cohen(Eastern Time) Disposition Final User 05/09/2014 4:05:49 PM Call Completed Ladona RidgelGaddy, RN, Sunny SchleinFelicia 05/09/2014 3:51:14 PM See Physician within 24 Hours Yes Ladona RidgelGaddy, RN, Sunny SchleinFelicia Caller Understands: Yes Disagree/Comply: Comply PLEASE NOTE: All timestamps contained within this report are represented as [place] Standard Time. CONFIDENTIALTY NOTICE: This fax transmission is intended only for the addressee. It contains information that is legally privileged, confidential or otherwise protected from use or disclosure. If you are not the intended recipient, you are strictly prohibited from reviewing, disclosing, copying using or disseminating any of this information or taking any action in reliance on or regarding this information. If you have received this fax in error, please notify us immediately by telephone so that we can arrange for its return to us. Phone: [phone], Toll-Free: [phone], Fax: [phone] Page: 2 of 2 Call Id: [id number] Care Advice Given Per Guideline SEE PHYSICIAN WITHIN 24 HOURS: * Fever over 100.5 F (38.1 C) * You become worse. After Care Instructions Given Call Event Type User Date / Time Description Comments User: Hampton AbbotFelicia, Gaddy, RN Date/Time Lamount Cohen(Eastern Time): 05/09/2014 3:47:29 PM 2 double back fusions - but this is not that he says

## 2014-05-09 NOTE — Telephone Encounter (Signed)
[facility] Patient Name: [name] DOB: [birth year] Initial Comment Caller states he is having nausea and some side/ back pain. Nurse Assessment Nurse: Ladona RidgelGaddy, RN, Felicia Date/Time (Eastern Time): 05/09/2014 3:44:41 PM Confirm and document reason for call. If symptomatic, describe symptoms. ---Pt has mild nausea that has gone. Level 6-7 Pain when moving in a certain way - mild when sitting -- in side of back below lower rib on the R side onset yesterday when he woke. No fever Has the patient traveled out of the country within the last 30 days? ---No Does the patient require triage? ---Yes Related visit to physician within the last 2 weeks? ---No Does the PT have any chronic conditions? (i.e. diabetes, asthma, etc.) ---No Guidelines Guideline Title Affirmed Question Affirmed Notes Flank Pain MODERATE pain (e.g., interfereswith normal activities or awakens from sleep) Final Disposition User See Physician within 24 Hours Ever[place]Gaddy, RN, [place] Comments 2 double back fusions - but this is not that he says PLEASE NOTE: All timestamps contained within this report are represented as [place] Standard Time. CONFIDENTIALTY NOTICE: This fax transmission is intended only for the addressee. It contains information that is legally privileged, confidential or otherwise protected from use or disclosure. If you are not the intended recipient, you are strictly prohibited from reviewing, disclosing, copying using or disseminating any of this information or taking any action in reliance on or regarding this information. If you have received this fax in error, please notify us immediately by telephone so that we can arrange for its return to us. Phone: [phone], Toll-Free: [phone], Fax: [phone] Page: 1 of 1 Call Id: [id number]

## 2014-05-09 NOTE — Telephone Encounter (Signed)
Pt has appt scheduled with Dr Para Marchuncan 05/10/14 at 10:30 AM.

## 2014-05-10 ENCOUNTER — Encounter: Payer: Self-pay | Admitting: Family Medicine

## 2014-05-10 ENCOUNTER — Ambulatory Visit (INDEPENDENT_AMBULATORY_CARE_PROVIDER_SITE_OTHER)
Admission: RE | Admit: 2014-05-10 | Discharge: 2014-05-10 | Disposition: A | Discharge status: Home / Self Care | Payer: 59 | Source: Ambulatory Visit | Attending: Family Medicine | Admitting: Family Medicine

## 2014-05-10 ENCOUNTER — Ambulatory Visit (INDEPENDENT_AMBULATORY_CARE_PROVIDER_SITE_OTHER): Payer: 59 | Admitting: Family Medicine

## 2014-05-10 VITALS — BP 102/64 | HR 84 | Temp 97.6°F | Wt 209.2 lb

## 2014-05-10 DIAGNOSIS — M545 Low back pain, unspecified: Secondary | ICD-10-CM

## 2014-05-10 DIAGNOSIS — R300 Dysuria: Secondary | ICD-10-CM

## 2014-05-10 DIAGNOSIS — M546 Pain in thoracic spine: Secondary | ICD-10-CM

## 2014-05-10 LAB — POCT URINALYSIS DIPSTICK
Bilirubin, UA: NEGATIVE
Glucose, UA: NEGATIVE
KETONES UA: NEGATIVE
Leukocytes, UA: NEGATIVE
Nitrite, UA: NEGATIVE
PH UA: 6
Protein, UA: NEGATIVE
Spec Grav, UA: 1.015
Urobilinogen, UA: 0.2

## 2014-05-10 LAB — URINALYSIS, MICROSCOPIC ONLY

## 2014-05-10 NOTE — Patient Instructions (Addendum)
I thought this was a muscle strain but you had a little blood in your urine. Go to the lab on the way out.  We'll contact you with your lab and xray report. Take care.

## 2014-05-10 NOTE — Progress Notes (Signed)
Pre visit review using our clinic review tool, if applicable. No additional management support is needed unless otherwise documented below in the visit note.  R lower back pain, not his typical pain.  No migration of the pain, noted in R flank.  occ brief nausea.  No fever, no chills no vomiting.  Going on 2-3 days.  No trauma.  Not like prev abd pain from summer 2015.  Drinking "lots" of soda" d/w pt about taper, no dysuria, no blood in urine, no h/o renal stones.   Meds, vitals, and allergies reviewed.   ROS: See HPI.  Otherwise, noncontributory.  nad ncat Mmm OP wnl Neck supple, no LA rrr ctab abd soft, not ttp Back w/o midline pain, does have R mid to lower pain on ROM

## 2014-05-12 LAB — URINE CULTURE
Colony Count: NO GROWTH
Organism ID, Bacteria: NO GROWTH

## 2014-05-12 NOTE — Assessment & Plan Note (Addendum)
Not his typical pain, not likely to be a stone, likely with false pos on the dip in clinic.  Likely a benign MSK strain, stretch and heat for now.  F/u prn.  See notes on labs and imaging.

## 2014-06-03 ENCOUNTER — Other Ambulatory Visit: Payer: Self-pay | Admitting: *Deleted

## 2014-06-03 MED ORDER — HYDROCODONE-ACETAMINOPHEN 10-325 MG PO TABS: ORAL_TABLET | ORAL | Status: DC

## 2014-06-03 MED ORDER — TRAMADOL HCL 50 MG PO TABS: 100.0000 mg | ORAL_TABLET | Freq: Four times a day (QID) | ORAL | Status: DC

## 2014-06-03 MED ORDER — TIZANIDINE HCL 4 MG PO TABS: ORAL_TABLET | ORAL | Status: DC

## 2014-06-03 NOTE — Telephone Encounter (Signed)
Patient left a voicemail requesting refills on medications. All 3 scripts were refilled on 05/06/14. Call patient when ready for pickup.

## 2014-06-03 NOTE — Telephone Encounter (Signed)
Printed.  Thanks.  

## 2014-06-04 NOTE — Telephone Encounter (Signed)
Patient advised.  Rx left at front desk for pick up. 

## 2014-07-02 ENCOUNTER — Other Ambulatory Visit: Payer: Self-pay

## 2014-07-02 NOTE — Telephone Encounter (Signed)
Pt left v/m requesting rx hydrocodone apap, tramadol and tizanidine. Call when ready for pick up. Pt last seen 05/10/14 and rxs last printed 06/03/14.

## 2014-07-03 MED ORDER — TRAMADOL HCL 50 MG PO TABS: 100.0000 mg | ORAL_TABLET | Freq: Four times a day (QID) | ORAL | Status: DC

## 2014-07-03 MED ORDER — TIZANIDINE HCL 4 MG PO TABS: ORAL_TABLET | ORAL | Status: DC

## 2014-07-03 MED ORDER — HYDROCODONE-ACETAMINOPHEN 10-325 MG PO TABS: ORAL_TABLET | ORAL | Status: DC

## 2014-07-03 NOTE — Telephone Encounter (Signed)
Printed.  Thanks. I'll sign when I get to clinic.  

## 2014-07-03 NOTE — Telephone Encounter (Signed)
Patient notified by telephone that script is up front ready for pickup. 

## 2014-07-31 ENCOUNTER — Other Ambulatory Visit: Payer: Self-pay | Admitting: *Deleted

## 2014-07-31 NOTE — Telephone Encounter (Signed)
Acute OV 05/10/14.  Last filled 07/03/14.  Patient request call back when ready for pick up (he will pick up hard copy of all).

## 2014-08-01 MED ORDER — TRAMADOL HCL 50 MG PO TABS: 100.0000 mg | ORAL_TABLET | Freq: Four times a day (QID) | ORAL | Status: DC

## 2014-08-01 MED ORDER — TIZANIDINE HCL 4 MG PO TABS: ORAL_TABLET | ORAL | Status: DC

## 2014-08-01 MED ORDER — HYDROCODONE-ACETAMINOPHEN 10-325 MG PO TABS: ORAL_TABLET | ORAL | Status: DC

## 2014-08-01 NOTE — Telephone Encounter (Signed)
Patient advised.  Rx left at front desk for pick up. 

## 2014-08-01 NOTE — Telephone Encounter (Signed)
Printed.  Thanks.  

## 2014-08-28 ENCOUNTER — Other Ambulatory Visit: Payer: Self-pay

## 2014-08-28 NOTE — Telephone Encounter (Signed)
Pt left v/m requesting rx hydrocodone apap,tizanidine and tramadol; all rx last printed on 08/01/14. Call when ready for pick up.pt last f/u appt on 10/15/13.

## 2014-08-29 MED ORDER — TIZANIDINE HCL 4 MG PO TABS: ORAL_TABLET | ORAL | Status: DC

## 2014-08-29 MED ORDER — HYDROCODONE-ACETAMINOPHEN 10-325 MG PO TABS: ORAL_TABLET | ORAL | Status: DC

## 2014-08-29 MED ORDER — TRAMADOL HCL 50 MG PO TABS: 100.0000 mg | ORAL_TABLET | Freq: Four times a day (QID) | ORAL | Status: DC

## 2014-08-29 NOTE — Telephone Encounter (Signed)
Printed.  Thanks.  

## 2014-08-29 NOTE — Telephone Encounter (Signed)
Left voicemail for patient that Rx is ready for pick up. Left in front office.

## 2014-09-26 ENCOUNTER — Other Ambulatory Visit: Payer: Self-pay

## 2014-09-26 NOTE — Telephone Encounter (Signed)
Pt left v/m requesting rx hydrocodone apap (rx last printed # 45 on 08/29/14), tizanidine (rx last printed # 30 on 08/29/14 and tramadol (last printed rx #240 on 08/29/14). Call when ready for pick up.pt last seen for back pain 05/10/2014.

## 2014-09-27 MED ORDER — TRAMADOL HCL 50 MG PO TABS: 100.0000 mg | ORAL_TABLET | Freq: Four times a day (QID) | ORAL | Status: DC

## 2014-09-27 MED ORDER — HYDROCODONE-ACETAMINOPHEN 10-325 MG PO TABS: ORAL_TABLET | ORAL | Status: DC

## 2014-09-27 MED ORDER — TIZANIDINE HCL 4 MG PO TABS: ORAL_TABLET | ORAL | Status: DC

## 2014-09-27 NOTE — Telephone Encounter (Signed)
Printed.  Thanks.  

## 2014-09-27 NOTE — Telephone Encounter (Signed)
Patient advised.  Rx left at front desk for pick up. 

## 2014-10-25 ENCOUNTER — Other Ambulatory Visit: Payer: Self-pay | Admitting: *Deleted

## 2014-10-25 MED ORDER — TIZANIDINE HCL 4 MG PO TABS: ORAL_TABLET | ORAL | Status: DC

## 2014-10-25 MED ORDER — HYDROCODONE-ACETAMINOPHEN 10-325 MG PO TABS: ORAL_TABLET | ORAL | Status: DC

## 2014-10-25 MED ORDER — TRAMADOL HCL 50 MG PO TABS: 100.0000 mg | ORAL_TABLET | Freq: Four times a day (QID) | ORAL | Status: DC

## 2014-10-25 NOTE — Telephone Encounter (Signed)
All printed.  Thanks.  

## 2014-10-25 NOTE — Telephone Encounter (Signed)
Patient advised.  Rx left at front desk for pick up. 

## 2014-10-25 NOTE — Telephone Encounter (Signed)
Pt left voicemail at Triage. Pt said he didn't realize he will be out of his meds tomorrow and is requesting a refill of all meds today

## 2014-11-21 ENCOUNTER — Other Ambulatory Visit: Payer: Self-pay

## 2014-11-21 MED ORDER — HYDROCODONE-ACETAMINOPHEN 10-325 MG PO TABS: ORAL_TABLET | ORAL | Status: DC

## 2014-11-21 MED ORDER — TIZANIDINE HCL 4 MG PO TABS: ORAL_TABLET | ORAL | Status: DC

## 2014-11-21 MED ORDER — TRAMADOL HCL 50 MG PO TABS: 100.0000 mg | ORAL_TABLET | Freq: Four times a day (QID) | ORAL | Status: DC

## 2014-11-21 NOTE — Telephone Encounter (Signed)
Pt left v/m requesting rx hydrocodone apap(last printed # 45 on 10/25/14), tramadol (last printed # 240 on 10/30/14) and tizanidine(last printed # 30 on 10/25/14). Last seen 05/10/14. Call when ready for pick up.

## 2014-11-21 NOTE — Telephone Encounter (Signed)
Left voicemail letting pt know Rx ready for pick up 

## 2014-11-21 NOTE — Telephone Encounter (Signed)
Printed.  Thanks.  

## 2014-12-19 ENCOUNTER — Other Ambulatory Visit: Payer: Self-pay

## 2014-12-19 NOTE — Telephone Encounter (Signed)
Pt left v/m requesting rx for hydrocodone apap (last printed # 45 on 11/21/14), tizanidine(last printed # 30 on 11/21/14 and tramadol(Last printed # 240 on 11/21/14.) pt last seen back pain 05/10/14 and last f/u appt on 10/15/13. No future appt scheduled.Please advise. Pt wants to pick up rx on 12/20/14 in early afternoon.

## 2014-12-20 MED ORDER — TIZANIDINE HCL 4 MG PO TABS: ORAL_TABLET | ORAL | Status: DC

## 2014-12-20 MED ORDER — HYDROCODONE-ACETAMINOPHEN 10-325 MG PO TABS: ORAL_TABLET | ORAL | Status: DC

## 2014-12-20 MED ORDER — TRAMADOL HCL 50 MG PO TABS: 100.0000 mg | ORAL_TABLET | Freq: Four times a day (QID) | ORAL | Status: DC

## 2014-12-20 NOTE — Telephone Encounter (Signed)
Printed.  Needs OV this fall.  Thanks.

## 2014-12-20 NOTE — Telephone Encounter (Signed)
Spoke to pt and informed him Rx is available for pickup from the front desk 

## 2014-12-26 ENCOUNTER — Ambulatory Visit (INDEPENDENT_AMBULATORY_CARE_PROVIDER_SITE_OTHER): Payer: 59 | Admitting: Family Medicine

## 2014-12-26 ENCOUNTER — Encounter: Payer: Self-pay | Admitting: Family Medicine

## 2014-12-26 VITALS — BP 120/76 | HR 83 | Temp 98.3°F | Wt 207.0 lb

## 2014-12-26 DIAGNOSIS — Z0000 Encounter for general adult medical examination without abnormal findings: Secondary | ICD-10-CM

## 2014-12-26 DIAGNOSIS — Z13220 Encounter for screening for lipoid disorders: Secondary | ICD-10-CM | POA: Diagnosis not present

## 2014-12-26 DIAGNOSIS — Z23 Encounter for immunization: Secondary | ICD-10-CM

## 2014-12-26 DIAGNOSIS — Z119 Encounter for screening for infectious and parasitic diseases, unspecified: Secondary | ICD-10-CM | POA: Diagnosis not present

## 2014-12-26 DIAGNOSIS — M545 Low back pain: Secondary | ICD-10-CM | POA: Diagnosis not present

## 2014-12-26 DIAGNOSIS — Z7189 Other specified counseling: Secondary | ICD-10-CM

## 2014-12-26 NOTE — Progress Notes (Signed)
Pre visit review using our clinic review tool, if applicable. No additional management support is needed unless otherwise documented below in the visit note.  CPE- See plan.  Routine anticipatory guidance given to patient.  See health maintenance. Tetanus 2013 Flu 2016 HIV neg 2016, d/w pt about screening.   Routine lipid screening d/w pt Colon and prostate CA screening not due.  PNA and shingles not due.  Diet and exercise d/w pt.  Wife designated if patient were incapacitated.    Back pain at baseline.  Still exercising, stretching.  No ADE on meds.  No sedation.  Stable dose of meds. No misuse.  Still with 2 tender points in lower back, just to either side of the lower spine- this is baseline for patient.  No B/B sx.  Overall, sx generally stable (some days better than others).  No weakness.    PMH and SH reviewed  Meds, vitals, and allergies reviewed.   ROS: See HPI.  Otherwise negative.    GEN: nad, alert and oriented HEENT: mucous membranes moist NECK: supple w/o LA CV: rrr. PULM: ctab, no inc wob ABD: soft, +bs EXT: no edema SKIN: no acute rash S/S wnl BLE.  Back w/o midline pain but ttp just to either side of the lowe L spine.   Old scar well healed.

## 2014-12-26 NOTE — Patient Instructions (Signed)
Go to the lab on the way out.  We'll contact you with your lab report. Take care.  Glad to see you.  Don't change your meds.

## 2014-12-27 DIAGNOSIS — Z0000 Encounter for general adult medical examination without abnormal findings: Secondary | ICD-10-CM | POA: Insufficient documentation

## 2014-12-27 DIAGNOSIS — Z7189 Other specified counseling: Secondary | ICD-10-CM | POA: Insufficient documentation

## 2014-12-27 LAB — COMPREHENSIVE METABOLIC PANEL
ALBUMIN: 4.6 g/dL (ref 3.5–5.2)
ALK PHOS: 53 U/L (ref 39–117)
ALT: 17 U/L (ref 0–53)
AST: 20 U/L (ref 0–37)
BILIRUBIN TOTAL: 0.4 mg/dL (ref 0.2–1.2)
BUN: 12 mg/dL (ref 6–23)
CO2: 28 mEq/L (ref 19–32)
Calcium: 9.5 mg/dL (ref 8.4–10.5)
Chloride: 103 mEq/L (ref 96–112)
Creatinine, Ser: 0.91 mg/dL (ref 0.40–1.50)
GFR: 98.48 mL/min (ref >60.00)
Glucose, Bld: 86 mg/dL (ref 70–99)
POTASSIUM: 4.5 meq/L (ref 3.5–5.1)
Sodium: 141 mEq/L (ref 135–145)
TOTAL PROTEIN: 7.4 g/dL (ref 6.0–8.3)

## 2014-12-27 LAB — HIV ANTIBODY (ROUTINE TESTING W REFLEX): HIV 1&2 Ab, 4th Generation: NONREACTIVE

## 2014-12-27 LAB — LDL CHOLESTEROL, DIRECT: LDL DIRECT: 196 mg/dL

## 2014-12-27 NOTE — Assessment & Plan Note (Signed)
Back pain at baseline.  Still exercising, stretching.  No ADE on meds.  No sedation.  Stable dose of meds. No misuse.  Still with 2 tender points in lower back, just to either side of the lower spine- this is baseline for patient.  No B/B sx.  Overall, sx generally stable (some days better than others).  No weakness.   Would continue as is.   I didn't write new rxs at OV, since not due.  I didn't count this as management of the problem; this was just an update for charting purpose.

## 2014-12-27 NOTE — Assessment & Plan Note (Signed)
Tetanus 2013 Flu 2016 HIV neg 2016, d/w pt about screening.   Routine lipid screening d/w pt Colon and prostate CA screening not due.  PNA and shingles not due.  Diet and exercise d/w pt.  Wife designated if patient were incapacitated.

## 2014-12-30 ENCOUNTER — Encounter: Payer: Self-pay | Admitting: Family Medicine

## 2014-12-30 DIAGNOSIS — E785 Hyperlipidemia, unspecified: Secondary | ICD-10-CM | POA: Insufficient documentation

## 2015-01-16 ENCOUNTER — Other Ambulatory Visit: Payer: Self-pay | Admitting: *Deleted

## 2015-01-16 MED ORDER — HYDROCODONE-ACETAMINOPHEN 10-325 MG PO TABS: ORAL_TABLET | ORAL | Status: DC

## 2015-01-16 MED ORDER — TRAMADOL HCL 50 MG PO TABS: 100.0000 mg | ORAL_TABLET | Freq: Four times a day (QID) | ORAL | Status: DC

## 2015-01-16 MED ORDER — TIZANIDINE HCL 4 MG PO TABS: ORAL_TABLET | ORAL | Status: DC

## 2015-01-16 NOTE — Telephone Encounter (Signed)
Last filled tramadol #120, Norco #45, and tizanidine #30 on 12/20/14.  Okay to refill?  Also, patient requests recommendations for otc supplements to help decrease cholesterol.  Please advise.

## 2015-01-16 NOTE — Telephone Encounter (Signed)
Printed.  Can try red yeast rice.  If may make his back pain worse, with more muscle aches.  If he has more aches and then stops the med, it should return to baseline.  Thanks.

## 2015-01-17 ENCOUNTER — Encounter: Payer: Self-pay | Admitting: Family Medicine

## 2015-01-17 NOTE — Telephone Encounter (Signed)
Lm on pts vm requesting a call back. Advised Rx is available for pickup from the front desk

## 2015-02-06 ENCOUNTER — Encounter: Payer: Self-pay | Admitting: Family Medicine

## 2015-02-13 ENCOUNTER — Other Ambulatory Visit: Payer: Self-pay

## 2015-02-13 NOTE — Telephone Encounter (Signed)
Pt left v/m requesting rx hydrocodone apap(last printed #45 on 01/16/15), tramadol (last printed # 240 on 01/16/15) and tizanidine(last printed # 30 on 01/16/15). Call when ready for pick up. Pt request to pick up on 02/14/15. Last annual exam 12/26/14.

## 2015-02-14 MED ORDER — TRAMADOL HCL 50 MG PO TABS: 100.0000 mg | ORAL_TABLET | Freq: Four times a day (QID) | ORAL | Status: DC

## 2015-02-14 MED ORDER — HYDROCODONE-ACETAMINOPHEN 10-325 MG PO TABS
ORAL_TABLET | ORAL | Status: DC
Start: 2015-02-14 — End: 2015-03-17

## 2015-02-14 MED ORDER — TIZANIDINE HCL 4 MG PO TABS: ORAL_TABLET | ORAL | Status: DC

## 2015-02-14 NOTE — Telephone Encounter (Signed)
Spoke to pt and informed him Rx is available for pickup from the front desk 

## 2015-02-14 NOTE — Telephone Encounter (Signed)
Printed.  Thanks.  

## 2015-03-17 ENCOUNTER — Telehealth: Payer: Self-pay

## 2015-03-17 NOTE — Telephone Encounter (Signed)
Pt left v/m requesting rx on hydrocodone apap (last printed # 45 on 02/14/15), tizanidine (last printed # 30 on 02/14/15) and tramadol (last refilled # 240 on 02/14/15). Last seen 12/26/14. Pt wants to pick up on 03/18/15.

## 2015-03-18 MED ORDER — TRAMADOL HCL 50 MG PO TABS: 100.0000 mg | ORAL_TABLET | Freq: Four times a day (QID) | ORAL | Status: DC

## 2015-03-18 MED ORDER — HYDROCODONE-ACETAMINOPHEN 10-325 MG PO TABS: ORAL_TABLET | ORAL | Status: DC

## 2015-03-18 MED ORDER — TIZANIDINE HCL 4 MG PO TABS: ORAL_TABLET | ORAL | Status: DC

## 2015-03-18 NOTE — Telephone Encounter (Signed)
Printed.  Thanks.  

## 2015-03-18 NOTE — Telephone Encounter (Signed)
Patient called about his refills.  He asked to be called when they're ready for pick up.

## 2015-03-18 NOTE — Telephone Encounter (Signed)
Patient advised.  Rx left at front desk for pick up. 

## 2015-04-15 ENCOUNTER — Other Ambulatory Visit: Payer: Self-pay

## 2015-04-15 NOTE — Telephone Encounter (Signed)
Pt left v/m requesting refill hydrocodone apap (last printed # 45 on 03/18/15), tizanidine(Last printed #30 on 03/18/15) and tramadol (last printed # 240 on 03/18/15). Last seen 12/26/14.

## 2015-04-16 MED ORDER — TIZANIDINE HCL 4 MG PO TABS: ORAL_TABLET | ORAL | Status: DC

## 2015-04-16 MED ORDER — HYDROCODONE-ACETAMINOPHEN 10-325 MG PO TABS: ORAL_TABLET | ORAL | Status: DC

## 2015-04-16 MED ORDER — TRAMADOL HCL 50 MG PO TABS: 100.0000 mg | ORAL_TABLET | Freq: Four times a day (QID) | ORAL | Status: DC

## 2015-04-16 NOTE — Telephone Encounter (Signed)
Patient notified by telephone that scripts are up front ready for pickup. 

## 2015-04-16 NOTE — Telephone Encounter (Signed)
Printed.  Thanks.  

## 2015-05-14 ENCOUNTER — Other Ambulatory Visit: Payer: Self-pay

## 2015-05-14 NOTE — Telephone Encounter (Signed)
Pt left v/m requesting rx hydrocodone apap(last printed # 45 on 04/16/15), tizanidine (last printed # 30 on 04/16/15) and tramadol (last printed #240 on 04/16/15). Call when ready for pick up.last annual exam on 12/26/14.

## 2015-05-15 MED ORDER — TIZANIDINE HCL 4 MG PO TABS: ORAL_TABLET | ORAL | Status: DC

## 2015-05-15 MED ORDER — TRAMADOL HCL 50 MG PO TABS: 100.0000 mg | ORAL_TABLET | Freq: Four times a day (QID) | ORAL | Status: DC

## 2015-05-15 MED ORDER — HYDROCODONE-ACETAMINOPHEN 10-325 MG PO TABS: ORAL_TABLET | ORAL | Status: DC

## 2015-05-15 NOTE — Telephone Encounter (Signed)
Left detailed message on voicemail. Rx left at front desk for pick up.  

## 2015-05-15 NOTE — Telephone Encounter (Signed)
Printed.  Thanks.  

## 2015-06-12 ENCOUNTER — Other Ambulatory Visit: Payer: Self-pay

## 2015-06-12 NOTE — Telephone Encounter (Signed)
Pt left v/m requesting rx hydrocodone apap(last printed # 45 on 05/15/15), tramadol(last printed # 240 on 05/15/15)and tizanidine (last printed # 30 on 05/15/15). Pt last annual 12/26/14.. Call when ready for pick up.

## 2015-06-13 MED ORDER — HYDROCODONE-ACETAMINOPHEN 10-325 MG PO TABS: ORAL_TABLET | ORAL | Status: DC

## 2015-06-13 MED ORDER — TRAMADOL HCL 50 MG PO TABS: 100.0000 mg | ORAL_TABLET | Freq: Four times a day (QID) | ORAL | Status: DC

## 2015-06-13 MED ORDER — TIZANIDINE HCL 4 MG PO TABS: ORAL_TABLET | ORAL | Status: DC

## 2015-06-13 NOTE — Telephone Encounter (Signed)
Printed.  Thanks.  

## 2015-06-13 NOTE — Telephone Encounter (Signed)
Patient advised.  Rx left at front desk for pick up. 

## 2015-07-14 ENCOUNTER — Other Ambulatory Visit: Payer: Self-pay

## 2015-07-14 NOTE — Telephone Encounter (Signed)
Pt left v/m requesting rx hydrocodone apap(last printed # 45 on 06/13/15), tizanidine(last printed # 30 on 03/10/7) and tramadol(last printed # 240 on 06/13/15. Pt last seen annual 12/26/14. Call when ready for pick up.

## 2015-07-15 NOTE — Telephone Encounter (Signed)
If needed to pick up today, then please ask another doc to sign this.  O/w, I'll sign on Wednesday.  Thanks.  

## 2015-07-16 MED ORDER — TIZANIDINE HCL 4 MG PO TABS: ORAL_TABLET | ORAL | Status: DC

## 2015-07-16 MED ORDER — TRAMADOL HCL 50 MG PO TABS: 100.0000 mg | ORAL_TABLET | Freq: Four times a day (QID) | ORAL | Status: DC

## 2015-07-16 MED ORDER — HYDROCODONE-ACETAMINOPHEN 10-325 MG PO TABS: ORAL_TABLET | ORAL | Status: DC

## 2015-07-16 NOTE — Telephone Encounter (Signed)
Patient notified by telephone that script is up front ready for pickup. 

## 2015-07-16 NOTE — Telephone Encounter (Signed)
Don't see where this was sent to another provider.

## 2015-07-16 NOTE — Telephone Encounter (Signed)
Printed.  Thanks.  

## 2015-08-13 ENCOUNTER — Other Ambulatory Visit: Payer: Self-pay

## 2015-08-13 MED ORDER — HYDROCODONE-ACETAMINOPHEN 10-325 MG PO TABS: ORAL_TABLET | ORAL | Status: DC

## 2015-08-13 MED ORDER — TRAMADOL HCL 50 MG PO TABS: 100.0000 mg | ORAL_TABLET | Freq: Four times a day (QID) | ORAL | Status: DC

## 2015-08-13 MED ORDER — TIZANIDINE HCL 4 MG PO TABS: ORAL_TABLET | ORAL | Status: DC

## 2015-08-13 NOTE — Telephone Encounter (Signed)
Patient notified and Rx's placed up front for pick up. 

## 2015-08-13 NOTE — Telephone Encounter (Signed)
Printed and in Kim's box 

## 2015-08-13 NOTE — Telephone Encounter (Signed)
Pt left v/m requesting rx for Hydrocodone apap (last printed # 45 on 07/16/15), tizanidine (last printed # 30 on 07/16/15) and tramadol (last printed # 240 on 07/16/15). Pt last seen 12/26/14.

## 2015-08-14 ENCOUNTER — Encounter: Payer: Self-pay | Admitting: Family Medicine

## 2015-08-27 ENCOUNTER — Encounter: Payer: Self-pay | Admitting: Family Medicine

## 2015-09-11 ENCOUNTER — Other Ambulatory Visit: Payer: Self-pay

## 2015-09-11 MED ORDER — HYDROCODONE-ACETAMINOPHEN 10-325 MG PO TABS: ORAL_TABLET | ORAL | Status: DC

## 2015-09-11 MED ORDER — TIZANIDINE HCL 4 MG PO TABS: ORAL_TABLET | ORAL | Status: DC

## 2015-09-11 MED ORDER — TRAMADOL HCL 50 MG PO TABS: 100.0000 mg | ORAL_TABLET | Freq: Four times a day (QID) | ORAL | Status: DC

## 2015-09-11 NOTE — Telephone Encounter (Signed)
Pt left v/m requesting rx hydrocodone apap (last printed # 45 on 08/13/15), tramadol (last printed # 240 on 08/13/15) and tizanidine (last printed # 30 on 08/13/15). Pt last seen 12/26/14. Call when ready for pick up. Pt request to pick up on 09/12/15 because pt will be out of med this weekend.

## 2015-09-11 NOTE — Telephone Encounter (Signed)
Left detailed message on voicemail. Rx left at front desk for pick up.  

## 2015-09-11 NOTE — Telephone Encounter (Signed)
Printed.  Thanks.  

## 2015-09-26 IMAGING — CR DG ABDOMEN 1V
1 series · 1 of 1 positions shown · non-contrast
Comparison: CT Abdomen and Pelvis 10/17/2013 and earlier.

CLINICAL DATA: 38-year-old male with left side flank/lumbar back
pain. Query stones. Initial encounter.

EXAM:
ABDOMEN - 1 VIEW

[view not recorded]
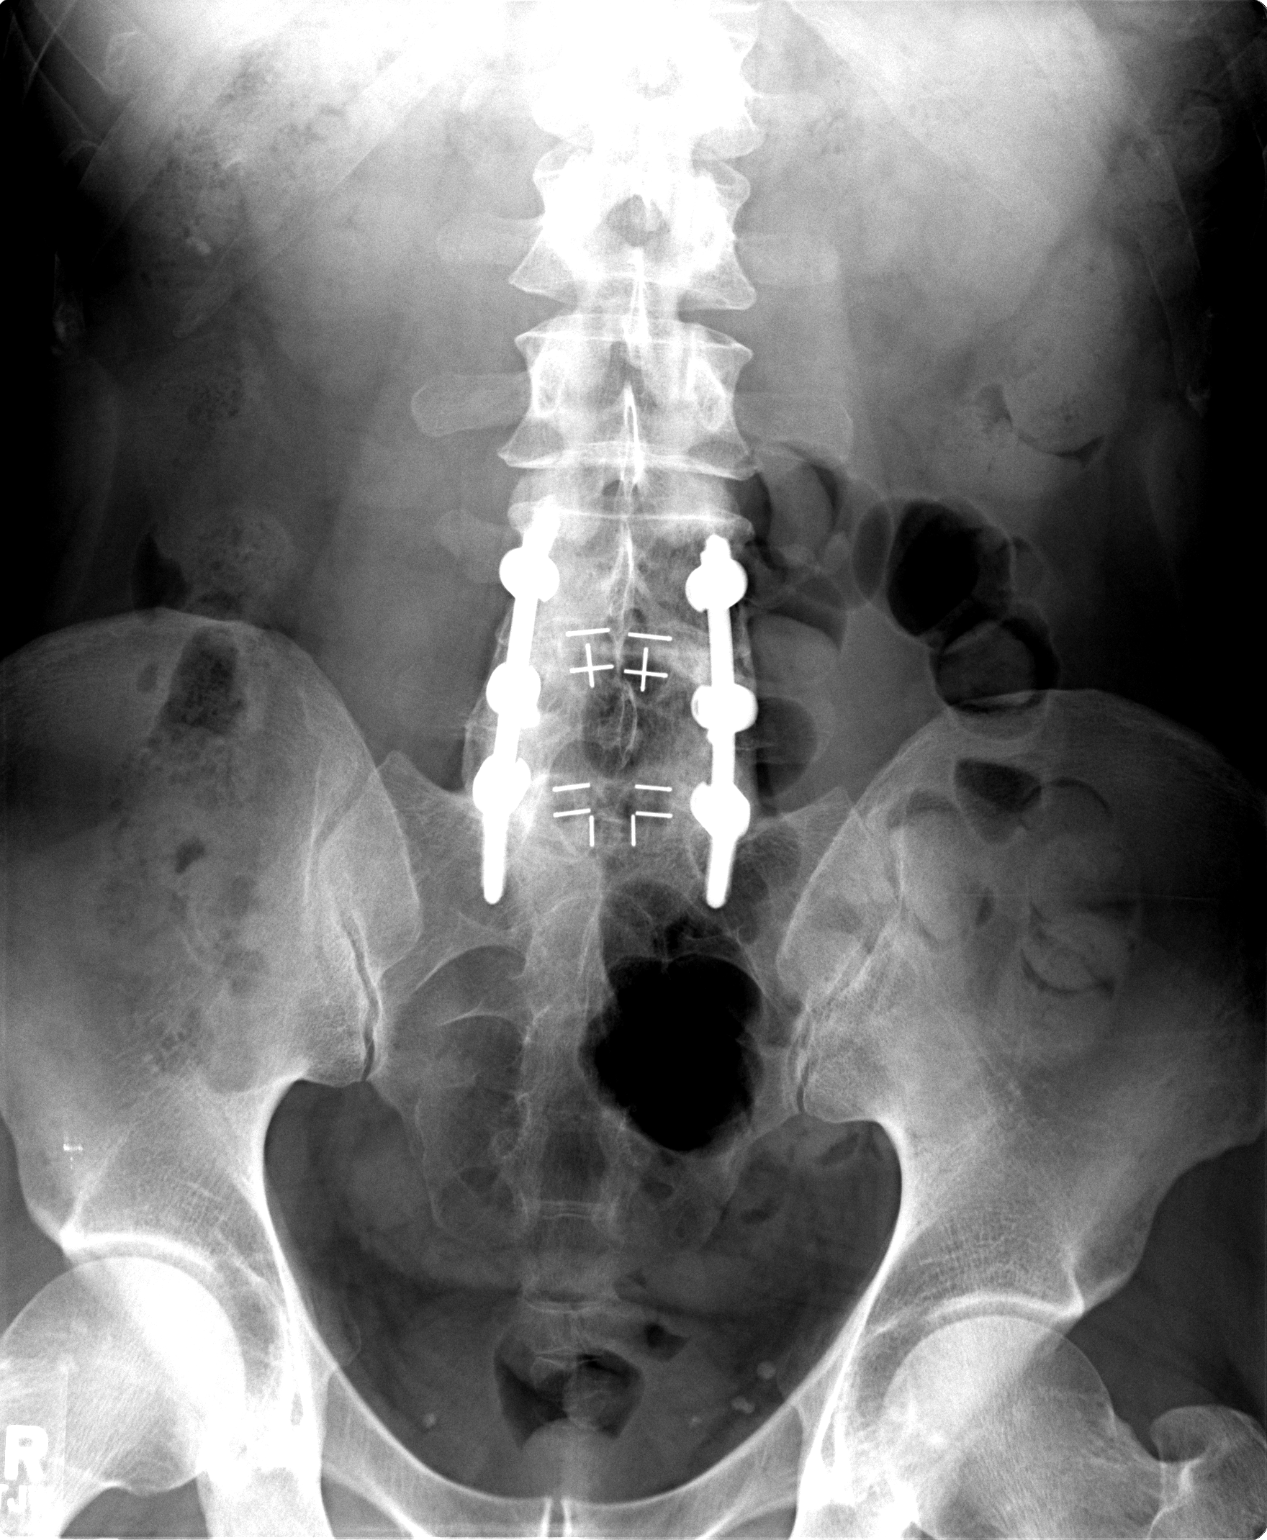

[1 of 1 positions shown; findings below may reference images not displayed]

FINDINGS: Sequelae of posterior and interbody lumbar fusion again noted. No
acute osseous abnormality identified. Non obstructed bowel gas
pattern. Chronic pelvic phleboliths. No nephrolithiasis or definite
urologic calculus.
IMPRESSION: No nephrolithiasis identified. Normal bowel gas pattern.
Postoperative changes to the lower lumbar spine.

## 2015-10-13 ENCOUNTER — Other Ambulatory Visit: Payer: Self-pay

## 2015-10-13 MED ORDER — HYDROCODONE-ACETAMINOPHEN 10-325 MG PO TABS: ORAL_TABLET | ORAL | Status: DC

## 2015-10-13 MED ORDER — TRAMADOL HCL 50 MG PO TABS: 100.0000 mg | ORAL_TABLET | Freq: Four times a day (QID) | ORAL | Status: DC

## 2015-10-13 MED ORDER — TIZANIDINE HCL 4 MG PO TABS: ORAL_TABLET | ORAL | Status: DC

## 2015-10-13 NOTE — Telephone Encounter (Signed)
Left detailed message on voicemail. Rx left at front desk for pick up.  

## 2015-10-13 NOTE — Telephone Encounter (Signed)
Printed.  Due for check up this fall.  Thanks.

## 2015-10-13 NOTE — Telephone Encounter (Signed)
Pt left v/m requesting rx hydrocodone apap(last printed # 45 on 09/11/15), tizanidine (last printed #30 on 09/11/15) and tramadol (last printed # 240 on 09/11/15) pt last seen 12/26/14.. Call when ready for pick up.

## 2015-11-10 ENCOUNTER — Other Ambulatory Visit: Payer: Self-pay

## 2015-11-10 MED ORDER — TRAMADOL HCL 50 MG PO TABS: 100.0000 mg | ORAL_TABLET | Freq: Four times a day (QID) | ORAL | 0 refills | Status: DC

## 2015-11-10 MED ORDER — TIZANIDINE HCL 4 MG PO TABS: ORAL_TABLET | ORAL | 0 refills | Status: DC

## 2015-11-10 MED ORDER — HYDROCODONE-ACETAMINOPHEN 10-325 MG PO TABS: ORAL_TABLET | ORAL | 0 refills | Status: DC

## 2015-11-10 NOTE — Telephone Encounter (Signed)
Pt is requesting a refill on the following medications.  Last OV 12/26/15 these meds were filled on 10/13/15 Okay to refill?

## 2015-11-10 NOTE — Telephone Encounter (Signed)
Printed.  Thanks.  

## 2015-11-11 NOTE — Telephone Encounter (Signed)
Left detailed message on voicemail. Rx left at front desk for pick up.  

## 2015-11-17 ENCOUNTER — Ambulatory Visit (INDEPENDENT_AMBULATORY_CARE_PROVIDER_SITE_OTHER): Payer: BLUE CROSS/BLUE SHIELD | Admitting: Internal Medicine

## 2015-11-17 ENCOUNTER — Encounter: Payer: Self-pay | Admitting: Internal Medicine

## 2015-11-17 ENCOUNTER — Ambulatory Visit (INDEPENDENT_AMBULATORY_CARE_PROVIDER_SITE_OTHER)
Admission: RE | Admit: 2015-11-17 | Discharge: 2015-11-17 | Disposition: A | Discharge status: Home / Self Care | Payer: BLUE CROSS/BLUE SHIELD | Source: Ambulatory Visit | Attending: Internal Medicine | Admitting: Internal Medicine

## 2015-11-17 ENCOUNTER — Telehealth: Payer: Self-pay | Admitting: Family Medicine

## 2015-11-17 VITALS — BP 106/70 | HR 100 | Temp 97.9°F | Wt 190.0 lb

## 2015-11-17 DIAGNOSIS — M5442 Lumbago with sciatica, left side: Secondary | ICD-10-CM | POA: Diagnosis not present

## 2015-11-17 MED ORDER — PREDNISONE 10 MG PO TABS: ORAL_TABLET | ORAL | 0 refills | Status: DC

## 2015-11-17 NOTE — Progress Notes (Addendum)
Subjective:    Patient ID: [name], male    DOB: September 24, 1975, 40 y.o.   MRN: [id number]  HPI  Pt presents to the clinic today with a complaint of left lower back pain and left leg weakness and numbness x 3 days.  He reports he first noticed the symptoms 3 days ago when his hamstring felt "tired" while golfing.  He states that earlier that morning he bent over to give his daughter a kiss, and she stopped him while he was trying to stand back up, but he did not feel back pain until later that day.  He describes the pain as a constant dull pain from his left lower back down to the buttock at a severity of 7/10.  He reports intermittent sharp pains with movement, and denies radiation down the left leg.  He admits to tripping over his left foot and difficulty with lifting his left toes.  He reports the numbness in his left side is located at his left lateral hip, and is worse when lying down.  He denies bowel or bladder incontinence.  He states he takes Tramadol, Tizanidine, and Norco daily for back pain and has had no relief of the current symptoms with these medications.  He had L5-S1 fusion in 2007 for a herniated disk, and reports the current pain feels "deep" and different than the previous back pain.  Review of Systems Past Medical History:  Diagnosis Date  . Back pain, chronic    Previous pain clinic eval and treatment  . Chronic rhinitis   . Family history of malignant neoplasm of gastrointestinal tract   . Ganglion of joint   . H/O: substance abuse    sober for years as of 2011. Has been responsible with Rx meds, prev was on lexpapro but had done well for extened period of time off med as of 2013  . Insomnia, unspecified   . Rash and other nonspecific skin eruption    Past Surgical History:  Procedure Laterality Date  . back fusion     2 level   Family History  Problem Relation Age of Onset  . Arthritis Father   . Hyperlipidemia Father   . Hypertension Father   . Atrial  fibrillation Father   . Arthritis Mother   . Hyperlipidemia Mother   . Hypertension Mother   . Supraventricular tachycardia Sister   . Colon cancer      <60-grandparents  . Hypertension      grandparents  . Heart disease      grandparents  . Prostate cancer Neg Hx    Current Outpatient Prescriptions on File Prior to Visit  Medication Sig Dispense Refill  . b complex vitamins tablet Take 1 tablet by mouth daily.    Marland Kitchen. HYDROcodone-acetaminophen (NORCO) 10-325 MG tablet Take up to 1.5 tabs per day. 45 tablet 0  . Multiple Vitamin (MULTIVITAMIN) tablet Take 1 tablet by mouth daily.    . naproxen sodium (ANAPROX) 220 MG tablet Take 440 mg by mouth every morning.     . Omega-3 Fatty Acids (FISH OIL) 1000 MG CAPS Take 1,000 mg by mouth daily.     Marland Kitchen. tiZANidine (ZANAFLEX) 4 MG tablet TAKE ONE TABLET BY MOUTH EVERY NIGHT AT BEDTIME 30 tablet 0  . traMADol (ULTRAM) 50 MG tablet Take 2 tablets (100 mg total) by mouth every 6 (six) hours. 240 tablet 0   No current facility-administered medications on file prior to visit.    No Known Allergies  GI: Denies bowel incontinence. GU: Denies bladder incontinence. MSK: Pt reports back pain and decreased ROM. Neuro: Pt reports numbness and tingling.     Objective:   Physical Exam  BP 106/70   Pulse 100   Temp 97.9 F (36.6 C) (Oral)   Wt 190 lb (86.2 kg)   SpO2 98%   BMI 24.07 kg/m   General: Well-appearing, in no acute distress. MSK: Lumbar spine and paraspinals nontender to palpation.  Full AROM lumbar spine, pain in left side with right lateral flexion and rotation.  Full AROM and strength at right hip.  Full AROM left hip except for decreased adduction.  Full strength left hip except for strength 4/5 left hip adduction.  Bilateral knees nontender to palpation.  Full AROM and strength bilateral knees.  Ankles nontender to palpation, pt reports tingling with palpation across top of left foot.  Full AROM of bilateral ankles, decreased ROM of  left toes.  Full strength at right ankle, 4/5 dorsiflexion strength at left ankle.  Peripheral pulses 1+.  Negative SLR test bilaterally.  Neuro: Cranial nerves intact.  Sensation to light touch intact throughout BLE.  Tandem gait unsteady, gait otherwise normal.        Assessment & Plan:   Low back pain with left side sciatica:  Xray of lumbar spine today eRx for Prednisone taper Continue Ultram, Norco, and Tizanidine as previously prescribed Back exercises provided Will follow-up based on imaging results, may need MRI and referral to neurosurgery  Call if symptoms persist or worsen BAITY, REGINA, NP

## 2015-11-17 NOTE — Telephone Encounter (Signed)
Pt called - he would like to have his xrays that were done today (back)  burnt to a disc. He will be in tomorrow morning to pick them up. Thanks

## 2015-11-17 NOTE — Progress Notes (Signed)
Patient ID: [name], male    DOB: 1976/01/21, 40 y.o.   MRN: [id number]  HPI  Pt presents to the clinic today with a complaint of left lower back pain and left leg weakness and numbness x 3 days.  He reports he first noticed the symptoms 3 days ago when his hamstring felt "tired" while golfing.  He states that earlier that morning he bent over to give his daughter a kiss, and she stopped him while he was trying to stand back up, but he did not feel back pain until later that day.  He describes the pain as a constant dull pain from his left lower back down to the buttock at a severity of 7/10.  He reports intermittent sharp pains with movement, and denies radiation down the left leg.  He admits to tripping over his left foot and difficulty with lifting his left toes.  He reports the numbness in his left side is located at his left lateral hip, and is worse when lying down.  He denies bowel or bladder incontinence.  He states he takes Tramadol, Tizanidine, and Norco daily for back pain and has had no relief of the current symptoms with these medications.  He had L5-S1 fusion in 2007 for a herniated disk, and reports the current pain feels "deep" and different than the previous back pain.  Review of Systems Past Medical History:  Diagnosis Date  . Back pain, chronic    Previous pain clinic eval and treatment  . Chronic rhinitis   . Family history of malignant neoplasm of gastrointestinal tract   . Ganglion of joint   . H/O: substance abuse    sober for years as of 2011. Has been responsible with Rx meds, prev was on lexpapro but had done well for extened period of time off med as of 2013  . Insomnia, unspecified   . Rash and other nonspecific skin eruption    Past Surgical History:  Procedure Laterality Date  . back fusion     2 level   Family History  Problem Relation Age of Onset  . Arthritis Father   . Hyperlipidemia Father   . Hypertension Father   . Atrial fibrillation Father   .  Arthritis Mother   . Hyperlipidemia Mother   . Hypertension Mother   . Supraventricular tachycardia Sister   . Colon cancer      <60-grandparents  . Hypertension      grandparents  . Heart disease      grandparents  . Prostate cancer Neg Hx    Current Outpatient Prescriptions on File Prior to Visit  Medication Sig Dispense Refill  . b complex vitamins tablet Take 1 tablet by mouth daily.    Marland Kitchen. HYDROcodone-acetaminophen (NORCO) 10-325 MG tablet Take up to 1.5 tabs per day. 45 tablet 0  . Multiple Vitamin (MULTIVITAMIN) tablet Take 1 tablet by mouth daily.    . naproxen sodium (ANAPROX) 220 MG tablet Take 440 mg by mouth every morning.     . Omega-3 Fatty Acids (FISH OIL) 1000 MG CAPS Take 1,000 mg by mouth daily.     Marland Kitchen. tiZANidine (ZANAFLEX) 4 MG tablet TAKE ONE TABLET BY MOUTH EVERY NIGHT AT BEDTIME 30 tablet 0  . traMADol (ULTRAM) 50 MG tablet Take 2 tablets (100 mg total) by mouth every 6 (six) hours. 240 tablet 0   No current facility-administered medications on file prior to visit.    No Known Allergies  GI: Denies bowel incontinence.  GU: Denies bladder incontinence. MSK: Pt reports back pain and decreased ROM. Neuro: Pt reports numbness and tingling.     Objective:   Physical Exam  BP 106/70   Pulse 100   Temp 97.9 F (36.6 C) (Oral)   Wt 190 lb (86.2 kg)   SpO2 98%   BMI 24.07 kg/m   General: Well-appearing, in no acute distress. MSK: Lumbar spine and paraspinals nontender to palpation.  Full AROM lumbar spine, pain in left side with right lateral flexion and rotation.  Full AROM and strength at right hip.  Full AROM left hip except for decreased adduction.  Full strength left hip except for strength 4/5 left hip adduction.  Bilateral knees nontender to palpation.  Full AROM and strength bilateral knees.  Ankles nontender to palpation, pt reports tingling with palpation across top of left foot.  Full AROM of bilateral ankles, decreased ROM of left toes.  Full strength  at right ankle, 4/5 dorsiflexion strength at left ankle.  Peripheral pulses 1+.  Negative SLR test bilaterally.  Neuro: Cranial nerves intact.  Sensation to light touch intact throughout BLE.  Tandem gait unsteady, gait otherwise normal.        Assessment & Plan:   Low back pain with left side sciatica:  Xray of lumbar spine today eRx for Prednisone taper Continue Ultram, Norco, and Tizanidine as previously prescribed Back exercises provided Will follow-up based on imaging results, may need MRI and referral to neurosurgery  Call if symptoms persist or worsen Margreat Widener, NP

## 2015-11-17 NOTE — Patient Instructions (Signed)
Sciatica With Rehab The sciatic nerve runs from the back down the leg and is responsible for sensation and control of the muscles in the back (posterior) side of the thigh, lower leg, and foot. Sciatica is a condition that is characterized by inflammation of this nerve.  SYMPTOMS   Signs of nerve damage, including numbness and/or weakness along the posterior side of the lower extremity.  Pain in the back of the thigh that may also travel down the leg.  Pain that worsens when sitting for long periods of time.  Occasionally, pain in the back or buttock. CAUSES  Inflammation of the sciatic nerve is the cause of sciatica. The inflammation is due to something irritating the nerve. Common sources of irritation include:  Sitting for long periods of time.  Direct trauma to the nerve.  Arthritis of the spine.  Herniated or ruptured disk.  Slipping of the vertebrae (spondylolisthesis).  Pressure from soft tissues, such as muscles or ligament-like tissue (fascia). RISK INCREASES WITH:  Sports that place pressure or stress on the spine (football or weightlifting).  Poor strength and flexibility.  Failure to warm up properly before activity.  Family history of low back pain or disk disorders.  Previous back injury or surgery.  Poor body mechanics, especially when lifting, or poor posture. PREVENTION   Warm up and stretch properly before activity.  Maintain physical fitness:  Strength, flexibility, and endurance.  Cardiovascular fitness.  Learn and use proper technique, especially with posture and lifting. When possible, have coach correct improper technique.  Avoid activities that place stress on the spine. PROGNOSIS If treated properly, then sciatica usually resolves within 6 weeks. However, occasionally surgery is necessary.  RELATED COMPLICATIONS   Permanent nerve damage, including pain, numbness, tingle, or weakness.  Chronic back pain.  Risks of surgery: infection,  bleeding, nerve damage, or damage to surrounding tissues. TREATMENT Treatment initially involves resting from any activities that aggravate your symptoms. The use of ice and medication may help reduce pain and inflammation. The use of strengthening and stretching exercises may help reduce pain with activity. These exercises may be performed at home or with referral to a therapist. A therapist may recommend further treatments, such as transcutaneous electronic nerve stimulation (TENS) or ultrasound. Your caregiver may recommend corticosteroid injections to help reduce inflammation of the sciatic nerve. If symptoms persist despite non-surgical (conservative) treatment, then surgery may be recommended. MEDICATION  If pain medication is necessary, then nonsteroidal anti-inflammatory medications, such as aspirin and ibuprofen, or other minor pain relievers, such as acetaminophen, are often recommended.  Do not take pain medication for 7 days before surgery.  Prescription pain relievers may be given if deemed necessary by your caregiver. Use only as directed and only as much as you need.  Ointments applied to the skin may be helpful.  Corticosteroid injections may be given by your caregiver. These injections should be reserved for the most serious cases, because they may only be given a certain number of times. HEAT AND COLD  Cold treatment (icing) relieves pain and reduces inflammation. Cold treatment should be applied for 10 to 15 minutes every 2 to 3 hours for inflammation and pain and immediately after any activity that aggravates your symptoms. Use ice packs or massage the area with a piece of ice (ice massage).  Heat treatment may be used prior to performing the stretching and strengthening activities prescribed by your caregiver, physical therapist, or athletic trainer. Use a heat pack or soak the injury in warm water.   SEEK MEDICAL CARE IF:  Treatment seems to offer no benefit, or the condition  worsens.  Any medications produce adverse side effects. EXERCISES  RANGE OF MOTION (ROM) AND STRETCHING EXERCISES - Sciatica Most people with sciatic will find that their symptoms worsen with either excessive bending forward (flexion) or arching at the low back (extension). The exercises which will help resolve your symptoms will focus on the opposite motion. Your physician, physical therapist or athletic trainer will help you determine which exercises will be most helpful to resolve your low back pain. Do not complete any exercises without first consulting with your clinician. Discontinue any exercises which worsen your symptoms until you speak to your clinician. If you have pain, numbness or tingling which travels down into your buttocks, leg or foot, the goal of the therapy is for these symptoms to move closer to your back and eventually resolve. Occasionally, these leg symptoms will get better, but your low back pain may worsen; this is typically an indication of progress in your rehabilitation. Be certain to be very alert to any changes in your symptoms and the activities in which you participated in the 24 hours prior to the change. Sharing this information with your clinician will allow him/her to most efficiently treat your condition. These exercises may help you when beginning to rehabilitate your injury. Your symptoms may resolve with or without further involvement from your physician, physical therapist or athletic trainer. While completing these exercises, remember:   Restoring tissue flexibility helps normal motion to return to the joints. This allows healthier, less painful movement and activity.  An effective stretch should be held for at least 30 seconds.  A stretch should never be painful. You should only feel a gentle lengthening or release in the stretched tissue. FLEXION RANGE OF MOTION AND STRETCHING EXERCISES: STRETCH - Flexion, Single Knee to Chest   Lie on a firm bed or floor  with both legs extended in front of you.  Keeping one leg in contact with the floor, bring your opposite knee to your chest. Hold your leg in place by either grabbing behind your thigh or at your knee.  Pull until you feel a gentle stretch in your low back. Hold __________ seconds.  Slowly release your grasp and repeat the exercise with the opposite side. Repeat __________ times. Complete this exercise __________ times per day.  STRETCH - Flexion, Double Knee to Chest  Lie on a firm bed or floor with both legs extended in front of you.  Keeping one leg in contact with the floor, bring your opposite knee to your chest.  Tense your stomach muscles to support your back and then lift your other knee to your chest. Hold your legs in place by either grabbing behind your thighs or at your knees.  Pull both knees toward your chest until you feel a gentle stretch in your low back. Hold __________ seconds.  Tense your stomach muscles and slowly return one leg at a time to the floor. Repeat __________ times. Complete this exercise __________ times per day.  STRETCH - Low Trunk Rotation   Lie on a firm bed or floor. Keeping your legs in front of you, bend your knees so they are both pointed toward the ceiling and your feet are flat on the floor.  Extend your arms out to the side. This will stabilize your upper body by keeping your shoulders in contact with the floor.  Gently and slowly drop both knees together to one side until   you feel a gentle stretch in your low back. Hold for __________ seconds.  Tense your stomach muscles to support your low back as you bring your knees back to the starting position. Repeat the exercise to the other side. Repeat __________ times. Complete this exercise __________ times per day  EXTENSION RANGE OF MOTION AND FLEXIBILITY EXERCISES: STRETCH - Extension, Prone on Elbows  Lie on your stomach on the floor, a bed will be too soft. Place your palms about shoulder  width apart and at the height of your head.  Place your elbows under your shoulders. If this is too painful, stack pillows under your chest.  Allow your body to relax so that your hips drop lower and make contact more completely with the floor.  Hold this position for __________ seconds.  Slowly return to lying flat on the floor. Repeat __________ times. Complete this exercise __________ times per day.  RANGE OF MOTION - Extension, Prone Press Ups  Lie on your stomach on the floor, a bed will be too soft. Place your palms about shoulder width apart and at the height of your head.  Keeping your back as relaxed as possible, slowly straighten your elbows while keeping your hips on the floor. You may adjust the placement of your hands to maximize your comfort. As you gain motion, your hands will come more underneath your shoulders.  Hold this position __________ seconds.  Slowly return to lying flat on the floor. Repeat __________ times. Complete this exercise __________ times per day.  STRENGTHENING EXERCISES - Sciatica  These exercises may help you when beginning to rehabilitate your injury. These exercises should be done near your "sweet spot." This is the neutral, low-back arch, somewhere between fully rounded and fully arched, that is your least painful position. When performed in this safe range of motion, these exercises can be used for people who have either a flexion or extension based injury. These exercises may resolve your symptoms with or without further involvement from your physician, physical therapist or athletic trainer. While completing these exercises, remember:   Muscles can gain both the endurance and the strength needed for everyday activities through controlled exercises.  Complete these exercises as instructed by your physician, physical therapist or athletic trainer. Progress with the resistance and repetition exercises only as your caregiver advises.  You may  experience muscle soreness or fatigue, but the pain or discomfort you are trying to eliminate should never worsen during these exercises. If this pain does worsen, stop and make certain you are following the directions exactly. If the pain is still present after adjustments, discontinue the exercise until you can discuss the trouble with your clinician. STRENGTHENING - Deep Abdominals, Pelvic Tilt   Lie on a firm bed or floor. Keeping your legs in front of you, bend your knees so they are both pointed toward the ceiling and your feet are flat on the floor.  Tense your lower abdominal muscles to press your low back into the floor. This motion will rotate your pelvis so that your tail bone is scooping upwards rather than pointing at your feet or into the floor.  With a gentle tension and even breathing, hold this position for __________ seconds. Repeat __________ times. Complete this exercise __________ times per day.  STRENGTHENING - Abdominals, Crunches   Lie on a firm bed or floor. Keeping your legs in front of you, bend your knees so they are both pointed toward the ceiling and your feet are flat on the   floor. Cross your arms over your chest.  Slightly tip your chin down without bending your neck.  Tense your abdominals and slowly lift your trunk high enough to just clear your shoulder blades. Lifting higher can put excessive stress on the low back and does not further strengthen your abdominal muscles.  Control your return to the starting position. Repeat __________ times. Complete this exercise __________ times per day.  STRENGTHENING - Quadruped, Opposite UE/LE Lift  Assume a hands and knees position on a firm surface. Keep your hands under your shoulders and your knees under your hips. You may place padding under your knees for comfort.  Find your neutral spine and gently tense your abdominal muscles so that you can maintain this position. Your shoulders and hips should form a rectangle  that is parallel with the floor and is not twisted.  Keeping your trunk steady, lift your right hand no higher than your shoulder and then your left leg no higher than your hip. Make sure you are not holding your breath. Hold this position __________ seconds.  Continuing to keep your abdominal muscles tense and your back steady, slowly return to your starting position. Repeat with the opposite arm and leg. Repeat __________ times. Complete this exercise __________ times per day.  STRENGTHENING - Abdominals and Quadriceps, Straight Leg Raise   Lie on a firm bed or floor with both legs extended in front of you.  Keeping one leg in contact with the floor, bend the other knee so that your foot can rest flat on the floor.  Find your neutral spine, and tense your abdominal muscles to maintain your spinal position throughout the exercise.  Slowly lift your straight leg off the floor about 6 inches for a count of 15, making sure to not hold your breath.  Still keeping your neutral spine, slowly lower your leg all the way to the floor. Repeat this exercise with each leg __________ times. Complete this exercise __________ times per day. POSTURE AND BODY MECHANICS CONSIDERATIONS - Sciatica Keeping correct posture when sitting, standing or completing your activities will reduce the stress put on different body tissues, allowing injured tissues a chance to heal and limiting painful experiences. The following are general guidelines for improved posture. Your physician or physical therapist will provide you with any instructions specific to your needs. While reading these guidelines, remember:  The exercises prescribed by your provider will help you have the flexibility and strength to maintain correct postures.  The correct posture provides the optimal environment for your joints to work. All of your joints have less wear and tear when properly supported by a spine with good posture. This means you will  experience a healthier, less painful body.  Correct posture must be practiced with all of your activities, especially prolonged sitting and standing. Correct posture is as important when doing repetitive low-stress activities (typing) as it is when doing a single heavy-load activity (lifting). RESTING POSITIONS Consider which positions are most painful for you when choosing a resting position. If you have pain with flexion-based activities (sitting, bending, stooping, squatting), choose a position that allows you to rest in a less flexed posture. You would want to avoid curling into a fetal position on your side. If your pain worsens with extension-based activities (prolonged standing, working overhead), avoid resting in an extended position such as sleeping on your stomach. Most people will find more comfort when they rest with their spine in a more neutral position, neither too rounded nor too   arched. Lying on a non-sagging bed on your side with a pillow between your knees, or on your back with a pillow under your knees will often provide some relief. Keep in mind, being in any one position for a prolonged period of time, no matter how correct your posture, can still lead to stiffness. PROPER SITTING POSTURE In order to minimize stress and discomfort on your spine, you must sit with correct posture Sitting with good posture should be effortless for a healthy body. Returning to good posture is a gradual process. Many people can work toward this most comfortably by using various supports until they have the flexibility and strength to maintain this posture on their own. When sitting with proper posture, your ears will fall over your shoulders and your shoulders will fall over your hips. You should use the back of the chair to support your upper back. Your low back will be in a neutral position, just slightly arched. You may place a small pillow or folded towel at the base of your low back for support.  When  working at a desk, create an environment that supports good, upright posture. Without extra support, muscles fatigue and lead to excessive strain on joints and other tissues. Keep these recommendations in mind: CHAIR:   A chair should be able to slide under your desk when your back makes contact with the back of the chair. This allows you to work closely.  The chair's height should allow your eyes to be level with the upper part of your monitor and your hands to be slightly lower than your elbows. BODY POSITION  Your feet should make contact with the floor. If this is not possible, use a foot rest.  Keep your ears over your shoulders. This will reduce stress on your neck and low back. INCORRECT SITTING POSTURES   If you are feeling tired and unable to assume a healthy sitting posture, do not slouch or slump. This puts excessive strain on your back tissues, causing more damage and pain. Healthier options include:  Using more support, like a lumbar pillow.  Switching tasks to something that requires you to be upright or walking.  Talking a brief walk.  Lying down to rest in a neutral-spine position. PROLONGED STANDING WHILE SLIGHTLY LEANING FORWARD  When completing a task that requires you to lean forward while standing in one place for a long time, place either foot up on a stationary 2-4 inch high object to help maintain the best posture. When both feet are on the ground, the low back tends to lose its slight inward curve. If this curve flattens (or becomes too large), then the back and your other joints will experience too much stress, fatigue more quickly and can cause pain.  CORRECT STANDING POSTURES Proper standing posture should be assumed with all daily activities, even if they only take a few moments, like when brushing your teeth. As in sitting, your ears should fall over your shoulders and your shoulders should fall over your hips. You should keep a slight tension in your abdominal  muscles to brace your spine. Your tailbone should point down to the ground, not behind your body, resulting in an over-extended swayback posture.  INCORRECT STANDING POSTURES  Common incorrect standing postures include a forward head, locked knees and/or an excessive swayback. WALKING Walk with an upright posture. Your ears, shoulders and hips should all line-up. PROLONGED ACTIVITY IN A FLEXED POSITION When completing a task that requires you to bend forward   at your waist or lean over a low surface, try to find a way to stabilize 3 of 4 of your limbs. You can place a hand or elbow on your thigh or rest a knee on the surface you are reaching across. This will provide you more stability so that your muscles do not fatigue as quickly. By keeping your knees relaxed, or slightly bent, you will also reduce stress across your low back. CORRECT LIFTING TECHNIQUES DO :   Assume a wide stance. This will provide you more stability and the opportunity to get as close as possible to the object which you are lifting.  Tense your abdominals to brace your spine; then bend at the knees and hips. Keeping your back locked in a neutral-spine position, lift using your leg muscles. Lift with your legs, keeping your back straight.  Test the weight of unknown objects before attempting to lift them.  Try to keep your elbows locked down at your sides in order get the best strength from your shoulders when carrying an object.  Always ask for help when lifting heavy or awkward objects. INCORRECT LIFTING TECHNIQUES DO NOT:   Lock your knees when lifting, even if it is a small object.  Bend and twist. Pivot at your feet or move your feet when needing to change directions.  Assume that you cannot safely pick up a paperclip without proper posture.   This information is not intended to replace advice given to you by your health care provider. Make sure you discuss any questions you have with your health care provider.     Document Released: 03/22/2005 Document Revised: 08/06/2014 Document Reviewed: 07/04/2008 [employer] Interactive Patient Education 2016 [employer]  

## 2015-11-18 ENCOUNTER — Other Ambulatory Visit: Payer: Self-pay | Admitting: Internal Medicine

## 2015-11-18 DIAGNOSIS — M5442 Lumbago with sciatica, left side: Secondary | ICD-10-CM

## 2015-11-18 NOTE — Telephone Encounter (Signed)
Pt came to pick up disc, Selena BattenKim made a copy of disc and gave it to him.

## 2015-11-26 ENCOUNTER — Encounter: Payer: Self-pay | Admitting: Internal Medicine

## 2015-11-28 ENCOUNTER — Telehealth: Payer: Self-pay

## 2015-11-28 MED ORDER — GABAPENTIN 300 MG PO CAPS: 300.0000 mg | ORAL_CAPSULE | Freq: Three times a day (TID) | ORAL | 1 refills | Status: DC

## 2015-11-28 NOTE — Telephone Encounter (Signed)
I would have him try adding on gabapentin, starting with 300mg  qd, gradually working up to 300mg  tid as long as he didn't have sedation or vertigo.  rx sent.    If needed he could increase the hydrocodone in the meantime, up to 1 tab BID- TID prn for pain. I realize he would likely run short and we can account for that with the next rx.  I think that would be the best option.  I would prefer not to repeat the pred taper immediately.    Please have him update us Monday.  Thanks.

## 2015-11-28 NOTE — Telephone Encounter (Signed)
Patient advised.

## 2015-11-28 NOTE — Telephone Encounter (Signed)
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 Pt may need thyroid labs for hypothyroidism . Pt wants to know if Dr Para Marchuncan could prescribe something for the back pain; pt is going out of town next week. Pt did not ask the neurosurgeon because he does not want pain med from more than one doctor. Pt request cb.

## 2015-12-11 ENCOUNTER — Ambulatory Visit (INDEPENDENT_AMBULATORY_CARE_PROVIDER_SITE_OTHER): Payer: BLUE CROSS/BLUE SHIELD | Admitting: Family Medicine

## 2015-12-11 ENCOUNTER — Encounter: Payer: Self-pay | Admitting: Family Medicine

## 2015-12-11 VITALS — BP 104/72 | HR 109 | Temp 98.4°F | Wt 192.2 lb

## 2015-12-11 DIAGNOSIS — R634 Abnormal weight loss: Secondary | ICD-10-CM | POA: Diagnosis not present

## 2015-12-11 LAB — COMPREHENSIVE METABOLIC PANEL
ALBUMIN: 4.5 g/dL (ref 3.5–5.2)
ALK PHOS: 53 U/L (ref 39–117)
ALT: 30 U/L (ref 0–53)
AST: 22 U/L (ref 0–37)
BUN: 7 mg/dL (ref 6–23)
CALCIUM: 9 mg/dL (ref 8.4–10.5)
CHLORIDE: 103 meq/L (ref 96–112)
CO2: 34 mEq/L — ABNORMAL HIGH (ref 19–32)
CREATININE: 0.99 mg/dL (ref 0.40–1.50)
GFR: 88.92 mL/min (ref >60.00)
Glucose, Bld: 76 mg/dL (ref 70–99)
POTASSIUM: 4.1 meq/L (ref 3.5–5.1)
SODIUM: 140 meq/L (ref 135–145)
TOTAL PROTEIN: 7.1 g/dL (ref 6.0–8.3)
Total Bilirubin: 0.4 mg/dL (ref 0.2–1.2)

## 2015-12-11 LAB — TSH: TSH: 1.79 u[IU]/mL (ref 0.35–4.50)

## 2015-12-11 LAB — CBC WITH DIFFERENTIAL/PLATELET
BASOS PCT: 0.9 % (ref 0.0–3.0)
Basophils Absolute: 0 10*3/uL (ref 0.0–0.1)
EOS PCT: 9.9 % — AB (ref 0.0–5.0)
Eosinophils Absolute: 0.5 10*3/uL (ref 0.0–0.7)
HEMATOCRIT: 41.5 % (ref 39.0–52.0)
HEMOGLOBIN: 14.2 g/dL (ref 13.0–17.0)
LYMPHS PCT: 31.2 % (ref 12.0–46.0)
Lymphs Abs: 1.7 10*3/uL (ref 0.7–4.0)
MCHC: 34.3 g/dL (ref 30.0–36.0)
MCV: 90.2 fl (ref 78.0–100.0)
MONO ABS: 0.5 10*3/uL (ref 0.1–1.0)
Monocytes Relative: 9.1 % (ref 3.0–12.0)
Neutro Abs: 2.6 10*3/uL (ref 1.4–7.7)
Neutrophils Relative %: 48.9 % (ref 43.0–77.0)
Platelets: 258 10*3/uL (ref 150.0–400.0)
RBC: 4.61 Mil/uL (ref 4.22–5.81)
RDW: 13.7 % (ref 11.5–15.5)
WBC: 5.4 10*3/uL (ref 4.0–10.5)

## 2015-12-11 MED ORDER — TRAMADOL HCL 50 MG PO TABS: 100.0000 mg | ORAL_TABLET | Freq: Four times a day (QID) | ORAL | 0 refills | Status: DC

## 2015-12-11 MED ORDER — HYDROCODONE-ACETAMINOPHEN 10-325 MG PO TABS: ORAL_TABLET | ORAL | 0 refills | Status: DC

## 2015-12-11 MED ORDER — TIZANIDINE HCL 4 MG PO TABS: ORAL_TABLET | ORAL | 0 refills | Status: DC

## 2015-12-11 NOTE — Progress Notes (Signed)
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  He has a bulging disc at L3-L4.  Still with weakness for dorsiflexion of the left foot. This is his recent baseline.  Weight loss noted.  He had been usually >200 lbs.  No known thyroid disease or DM2.  No FCNAVD.  He had sweats but only while on prednisone.    He has had dec in BMs recently, once a week, still solid but greenish in the last 2 weeks.  No blood in stool.  Possibly a scant amount of mucous with the BMs.  No abd pain.  Appetite is slightly lower than prev.    Meds, vitals, and allergies reviewed.   ROS: Per HPI unless specifically indicated in ROS section   GEN: nad, alert and oriented HEENT: mucous membranes moist NECK: supple w/o LA, no tmg on exam CV: rrr.  PULM: ctab, no inc wob ABD: soft, +bs EXT: no edema SKIN: no acute rash Weakness on dorsiflexion of left great toe noted. No weakness on plantar flexion. No weakness on the right foot.

## 2015-12-11 NOTE — Patient Instructions (Signed)
Go to the lab on the way out.  We'll contact you with your lab report. If your stools don't return to normal, then let me know.   Take care.  Glad to see you.

## 2015-12-11 NOTE — Progress Notes (Signed)
Pre visit review using our clinic review tool, if applicable. No additional management support is needed unless otherwise documented below in the visit note. 

## 2015-12-12 DIAGNOSIS — R634 Abnormal weight loss: Secondary | ICD-10-CM | POA: Insufficient documentation

## 2015-12-12 NOTE — Assessment & Plan Note (Signed)
Unclear source. No thyromegaly on exam. Check baseline labs today. At this point still okay for outpatient follow-up. He does have some changes in bowel movements recently. If these don't resolve soon, then he'll need workup for that. Unclear if this is related to the weight loss. See notes on labs.>25 minutes spent in face to face time with patient, >50% spent in counselling or coordination of care.  At this point I think the weight loss is still unrelated to the left leg symptoms. I'll await neurosurgery input.

## 2015-12-14 ENCOUNTER — Telehealth: Payer: Self-pay | Admitting: Family Medicine

## 2015-12-14 NOTE — Telephone Encounter (Signed)
Please get update from patient about his GI symptoms. Let me know how he is feeling.  Thanks.

## 2015-12-15 NOTE — Telephone Encounter (Signed)
Spoke to patient and was advised that things are about the same and he has not had a bowel movement since Wednesday. Patient stated that he is not having any symptoms and he will let you know when he has a BM and how it is.

## 2015-12-15 NOTE — Telephone Encounter (Signed)
Left message on voicemail for patient to call back. 

## 2015-12-15 NOTE — Telephone Encounter (Signed)
Pt returned your call best number [phone]

## 2015-12-16 NOTE — Telephone Encounter (Signed)
Patient notified as instructed by telephone and verbalized understanding. 

## 2015-12-16 NOTE — Telephone Encounter (Signed)
I would add on MiraLAX or fiber daily in the meantime if he is not having a bowel movement. Thanks. Please update me as needed.

## 2015-12-24 ENCOUNTER — Telehealth: Payer: Self-pay | Admitting: Family Medicine

## 2015-12-24 NOTE — Telephone Encounter (Signed)
Patient called to find out if Dr.Duncan still wants to see him for his physical this Friday.  Patient said he was seen last week by Dr.Duncan and had lab work done.  Patient,also,wanted to let Dr.Duncan know he's having surgery on his leg on Monday.

## 2015-12-24 NOTE — Telephone Encounter (Signed)
Patient notified as instructed by telephone and verbalized understanding. Patient stated that he has not lost any more weight and his stools are back to normal. Patient requested that the appointment Friday be cancelled and he will call and schedule his physical after he gets over his surgery. Appointment cancelled per patient's request.

## 2015-12-24 NOTE — Telephone Encounter (Signed)
His physical is important, but the surgery is more important. I am more interested in him getting through the surgery, and we can do the physical after that. See if this is okay with him.  In the meantime, please get update about his weight and his stools. Has he lost anymore weight in the meantime? Have his stools returned to normal? If he is still having trouble with either one of those, we may need to address that before the surgery. It may be reasonable to keep the appointment on Friday to address any of those residual issues, should they still be going on. Thanks.

## 2015-12-26 ENCOUNTER — Encounter: Payer: Self-pay | Admitting: Family Medicine

## 2016-01-08 ENCOUNTER — Other Ambulatory Visit: Payer: Self-pay

## 2016-01-08 NOTE — Telephone Encounter (Signed)
Patient is requesting refills on his Tramadol, Norco and Tizanidine.    Last filled each on 12/11/15: Tramadol #240 Norco # 45 Tizanidine #30  Please Advise. Last seen by Dr. Para Marchuncan 12/11/15.  Appears to have a Neurosurgery appointment on 03/04/2016.

## 2016-01-09 MED ORDER — TRAMADOL HCL 50 MG PO TABS: 100.0000 mg | ORAL_TABLET | Freq: Four times a day (QID) | ORAL | 0 refills | Status: DC

## 2016-01-09 MED ORDER — HYDROCODONE-ACETAMINOPHEN 10-325 MG PO TABS: ORAL_TABLET | ORAL | 0 refills | Status: DC

## 2016-01-09 MED ORDER — TIZANIDINE HCL 4 MG PO TABS: ORAL_TABLET | ORAL | 0 refills | Status: DC

## 2016-01-09 NOTE — Telephone Encounter (Signed)
Printed.  Thanks.  How is he feeling?

## 2016-01-09 NOTE — Telephone Encounter (Signed)
Patient says his leg is doing pretty good.  The bandage comes off Monday.  The GI issues are much better.    Patient says his surgeon made a comment that one of the tests for blood sugar was not done and that he probably needed that test.    Patient advised that his Rx was ready for pickup.  Rx left at front desk for pick up.

## 2016-02-09 ENCOUNTER — Other Ambulatory Visit: Payer: Self-pay

## 2016-02-09 NOTE — Telephone Encounter (Signed)
Pt left v/m requesting rx hydrocodone apap(last printed # 45 on 01/09/16) tizanidine (last printed # 30 on 01/09/16) and tramadol (last printed # 240 on 01/09/16. Pt last seen 12/11/15. Call when ready for pick up.

## 2016-02-10 MED ORDER — HYDROCODONE-ACETAMINOPHEN 10-325 MG PO TABS: ORAL_TABLET | ORAL | 0 refills | Status: DC

## 2016-02-10 MED ORDER — TRAMADOL HCL 50 MG PO TABS: 100.0000 mg | ORAL_TABLET | Freq: Four times a day (QID) | ORAL | 0 refills | Status: DC

## 2016-02-10 MED ORDER — TIZANIDINE HCL 4 MG PO TABS: ORAL_TABLET | ORAL | 0 refills | Status: DC

## 2016-02-10 NOTE — Telephone Encounter (Signed)
Printed.  Thanks.  

## 2016-02-10 NOTE — Telephone Encounter (Signed)
Patient advised. Rx left at front desk for pick up.   Patient states to let Dr. Para Marchuncan know he is getting a steroid shot in his back today.

## 2016-02-10 NOTE — Telephone Encounter (Signed)
Noted. Thanks.

## 2016-03-08 ENCOUNTER — Other Ambulatory Visit: Payer: Self-pay | Admitting: *Deleted

## 2016-03-08 MED ORDER — TIZANIDINE HCL 4 MG PO TABS: ORAL_TABLET | ORAL | 0 refills | Status: DC

## 2016-03-08 MED ORDER — HYDROCODONE-ACETAMINOPHEN 10-325 MG PO TABS: ORAL_TABLET | ORAL | 0 refills | Status: DC

## 2016-03-08 MED ORDER — TRAMADOL HCL 50 MG PO TABS: 100.0000 mg | ORAL_TABLET | Freq: Four times a day (QID) | ORAL | 0 refills | Status: DC

## 2016-03-08 NOTE — Telephone Encounter (Signed)
Patient left a voicemail requesting refills Tramadol 02/10/16 #240 Hydrocodone 02/10/16 #45 Tizanidine 02/10/16 #30 Last office visit 12/11/15 Call when ready

## 2016-03-08 NOTE — Telephone Encounter (Signed)
Printed.  Thanks.  

## 2016-03-08 NOTE — Telephone Encounter (Signed)
Left detailed message on voicemail. Rx left at front desk for pick up.  

## 2016-04-05 ENCOUNTER — Other Ambulatory Visit: Payer: Self-pay | Admitting: Family Medicine

## 2016-04-05 DIAGNOSIS — G8929 Other chronic pain: Secondary | ICD-10-CM

## 2016-04-06 DIAGNOSIS — G8929 Other chronic pain: Secondary | ICD-10-CM | POA: Insufficient documentation

## 2016-04-06 MED ORDER — TIZANIDINE HCL 4 MG PO TABS: ORAL_TABLET | ORAL | 0 refills | Status: DC

## 2016-04-06 MED ORDER — HYDROCODONE-ACETAMINOPHEN 10-325 MG PO TABS: ORAL_TABLET | ORAL | 0 refills | Status: DC

## 2016-04-06 MED ORDER — TRAMADOL HCL 50 MG PO TABS: 100.0000 mg | ORAL_TABLET | Freq: Four times a day (QID) | ORAL | 0 refills | Status: DC

## 2016-04-06 NOTE — Telephone Encounter (Signed)
Indication for chronic opioid: chronic back pain s/p surgery Medication and dose: hydrocodone 10/325mg  # pills per month: 45 Last UDS date: 08/14/15 Pain contract signed (Y/N): yes Date narcotic database last reviewed (include red flags): 04/06/16  rxs printed.  Thanks.

## 2016-04-06 NOTE — Telephone Encounter (Signed)
Patient advised.  Rx left at front desk for pick up. 

## 2016-04-06 NOTE — Telephone Encounter (Signed)
MyChart RF request:  Last Filled:   Tizanidine  30 tablet 0 03/08/2016  Last Filled:   Hydrocodone   45 tablet 0 03/08/2016  Last Filled:   Tramadol  240 tablet 0 03/08/2016

## 2016-05-03 ENCOUNTER — Other Ambulatory Visit: Payer: Self-pay

## 2016-05-03 MED ORDER — HYDROCODONE-ACETAMINOPHEN 10-325 MG PO TABS: ORAL_TABLET | ORAL | 0 refills | Status: DC

## 2016-05-03 MED ORDER — TIZANIDINE HCL 4 MG PO TABS: ORAL_TABLET | ORAL | 0 refills | Status: DC

## 2016-05-03 MED ORDER — TRAMADOL HCL 50 MG PO TABS: 100.0000 mg | ORAL_TABLET | Freq: Four times a day (QID) | ORAL | 0 refills | Status: DC

## 2016-05-03 NOTE — Telephone Encounter (Signed)
Pt left v/m requesting rx hydrocodone apap (last printed # 45 on 04/06/16), tramadol (last printed # 240 on 04/06/16) and tizanidine (last printed # 30 on 04/06/16. pt last seen 12/11/15. Call when ready for pick up.

## 2016-05-03 NOTE — Telephone Encounter (Signed)
Printed.  Thanks.  

## 2016-05-04 NOTE — Telephone Encounter (Signed)
Patient notified by telephone that script is up front ready for pickup. 

## 2016-05-04 NOTE — Telephone Encounter (Signed)
Patient called to find out if rx is ready for pick up.  Patient's in the area.  Please call patient when rx is ready at [phone].

## 2016-06-01 ENCOUNTER — Other Ambulatory Visit: Payer: Self-pay | Admitting: Family Medicine

## 2016-06-01 NOTE — Telephone Encounter (Signed)
Received refill request electronically Last refills on 05/03/16 Hydrocodone #45 Zanaflex  #30 Tramadol #240 Last office visit 12/21/15

## 2016-06-02 MED ORDER — TRAMADOL HCL 50 MG PO TABS: 100.0000 mg | ORAL_TABLET | Freq: Four times a day (QID) | ORAL | 0 refills | Status: DC

## 2016-06-02 MED ORDER — HYDROCODONE-ACETAMINOPHEN 10-325 MG PO TABS: ORAL_TABLET | ORAL | 0 refills | Status: DC

## 2016-06-02 MED ORDER — TIZANIDINE HCL 4 MG PO TABS: ORAL_TABLET | ORAL | 0 refills | Status: DC

## 2016-06-02 NOTE — Telephone Encounter (Signed)
Printed.  Thanks.  

## 2016-06-02 NOTE — Telephone Encounter (Signed)
Patient notified by telephone that script is up front ready for pickup. 

## 2016-06-14 ENCOUNTER — Encounter: Payer: BLUE CROSS/BLUE SHIELD | Admitting: Family Medicine

## 2016-06-30 ENCOUNTER — Ambulatory Visit (INDEPENDENT_AMBULATORY_CARE_PROVIDER_SITE_OTHER): Payer: BLUE CROSS/BLUE SHIELD | Admitting: Family Medicine

## 2016-06-30 ENCOUNTER — Encounter: Payer: Self-pay | Admitting: Family Medicine

## 2016-06-30 VITALS — BP 110/74 | HR 92 | Temp 97.8°F | Ht 74.0 in | Wt 201.5 lb

## 2016-06-30 DIAGNOSIS — E785 Hyperlipidemia, unspecified: Secondary | ICD-10-CM | POA: Diagnosis not present

## 2016-06-30 DIAGNOSIS — G8929 Other chronic pain: Secondary | ICD-10-CM

## 2016-06-30 DIAGNOSIS — Z0000 Encounter for general adult medical examination without abnormal findings: Secondary | ICD-10-CM

## 2016-06-30 MED ORDER — HYDROCODONE-ACETAMINOPHEN 10-325 MG PO TABS: ORAL_TABLET | ORAL | 0 refills | Status: DC

## 2016-06-30 MED ORDER — TRAMADOL HCL 50 MG PO TABS: 100.0000 mg | ORAL_TABLET | Freq: Four times a day (QID) | ORAL | 0 refills | Status: DC

## 2016-06-30 MED ORDER — TIZANIDINE HCL 4 MG PO TABS: ORAL_TABLET | ORAL | 0 refills | Status: DC

## 2016-06-30 NOTE — Progress Notes (Signed)
CPE- See plan.  Routine anticipatory guidance given to patient.  See health maintenance.  The possibility exists that previously documented standard health maintenance information may have been brought forward from a previous encounter into this note.  If needed, that same information has been updated to reflect the current situation based on today's encounter.    Tetanus 2013 Flu encouraged HIV neg 2016, d/w pt about screening.   Colon and prostate CA screening not due.  PNA and shingles not due.  Diet and exercise d/w pt.  Wife designated if patient were incapacitated.   Recheck labs pending, h/o HLD noted.   He has maintained his sobriety for years now.  He golfed w/o stretching and strained his back, R lower back, lateral to old incision. Pain with R hip flexion. Feels better laying flat.  No radicular pain.  No other trauma.  Has been going on for 2 weeks.  We talked about stretching and he'll update me as needed.  Still on baseline pain meds w/o ADE.    PMH and SH reviewed  Meds, vitals, and allergies reviewed.   ROS: Per HPI.  Unless specifically indicated otherwise in HPI, the patient denies:  General: fever. Eyes: acute vision changes ENT: sore throat Cardiovascular: chest pain Respiratory: SOB GI: vomiting GU: dysuria Musculoskeletal: acute back pain Derm: acute rash Neuro: acute motor dysfunction Psych: worsening mood Endocrine: polydipsia Heme: bleeding Allergy: hayfever  GEN: nad, alert and oriented HEENT: mucous membranes moist NECK: supple w/o LA CV: rrr. PULM: ctab, no inc wob ABD: soft, +bs EXT: no edema SKIN: no acute rash Back nontender in midline but he has noted spasm to the right side of midline in the lower back. He has reproducible back pain with R hip flexion, with active but not passive range of motion. This pain does not radiate down the leg. Grossly neurovascularly intact distally.

## 2016-06-30 NOTE — Progress Notes (Signed)
Pre visit review using our clinic review tool, if applicable. No additional management support is needed unless otherwise documented below in the visit note. 

## 2016-06-30 NOTE — Patient Instructions (Signed)
Go to the lab on the way out.  We'll contact you with your lab report. If your back isn't getting better with stretching and ice and heat, then let me know.  Take care.  Glad to see you.

## 2016-07-01 LAB — COMPREHENSIVE METABOLIC PANEL
ALBUMIN: 4.5 g/dL (ref 3.5–5.2)
ALT: 18 U/L (ref 0–53)
AST: 16 U/L (ref 0–37)
Alkaline Phosphatase: 57 U/L (ref 39–117)
BUN: 9 mg/dL (ref 6–23)
CHLORIDE: 102 meq/L (ref 96–112)
CO2: 31 meq/L (ref 19–32)
CREATININE: 0.93 mg/dL (ref 0.40–1.50)
Calcium: 9.3 mg/dL (ref 8.4–10.5)
GFR: 95.3 mL/min (ref >60.00)
Glucose, Bld: 97 mg/dL (ref 70–99)
POTASSIUM: 4.4 meq/L (ref 3.5–5.1)
SODIUM: 139 meq/L (ref 135–145)
Total Bilirubin: 0.3 mg/dL (ref 0.2–1.2)
Total Protein: 7.1 g/dL (ref 6.0–8.3)

## 2016-07-01 LAB — LIPID PANEL
CHOLESTEROL: 250 mg/dL — AB (ref 0–200)
HDL: 54.3 mg/dL (ref >39.00)
NonHDL: 195.94
TRIGLYCERIDES: 251 mg/dL — AB (ref 0.0–149.0)
Total CHOL/HDL Ratio: 5
VLDL: 50.2 mg/dL — ABNORMAL HIGH (ref 0.0–40.0)

## 2016-07-01 LAB — HEMOGLOBIN A1C: Hgb A1c MFr Bld: 5.5 % (ref 4.6–6.5)

## 2016-07-01 LAB — LDL CHOLESTEROL, DIRECT: Direct LDL: 160 mg/dL

## 2016-07-01 NOTE — Assessment & Plan Note (Signed)
Tetanus 2013 Flu encouraged HIV neg 2016, d/w pt about screening.   Colon and prostate CA screening not due.  PNA and shingles not due.  Diet and exercise d/w pt.  Wife designated if patient were incapacitated.   Recheck labs pending, h/o HLD noted.

## 2016-07-01 NOTE — Assessment & Plan Note (Signed)
History of.  See notes on labs. 

## 2016-07-01 NOTE — Assessment & Plan Note (Signed)
No adverse effect on medications. There is the issue of his chronic back pain which has been fairly well managed with a combination of medicine, stretching, exercise. There is a separate issue of the more recent acute pain. He does not have a lot of pain unless he has certain movements. Discussed with patient about ice, heat, stretching. At this point he doesn't have emergent symptoms and he would not need surgical evaluation at this point so it is okay to defer extra imaging for now. He does likely have significant muscle spasm. If he is getting worse or if he doesn't get any better he will let me know. He agrees. At this point okay for outpatient follow-up.

## 2016-07-28 ENCOUNTER — Other Ambulatory Visit: Payer: Self-pay | Admitting: Family Medicine

## 2016-07-28 MED ORDER — TRAMADOL HCL 50 MG PO TABS: 100.0000 mg | ORAL_TABLET | Freq: Four times a day (QID) | ORAL | 0 refills | Status: DC

## 2016-07-28 MED ORDER — HYDROCODONE-ACETAMINOPHEN 10-325 MG PO TABS: ORAL_TABLET | ORAL | 0 refills | Status: DC

## 2016-07-28 MED ORDER — TIZANIDINE HCL 4 MG PO TABS: ORAL_TABLET | ORAL | 0 refills | Status: DC

## 2016-07-28 NOTE — Telephone Encounter (Signed)
Printed.  Thanks.  

## 2016-07-28 NOTE — Telephone Encounter (Signed)
Received refill request electronically Tramadol LR 07/01/15 #240 Zanaflex  LR 06/30/16 #30 Hydrocodone LR 06/30/16 #45 Last office visit 06/30/16

## 2016-07-28 NOTE — Telephone Encounter (Signed)
Patient notified by telephone that script is up front and ready for pickup. 

## 2016-08-26 ENCOUNTER — Other Ambulatory Visit: Payer: Self-pay | Admitting: Family Medicine

## 2016-08-27 NOTE — Telephone Encounter (Signed)
Electronic refill request. Last office visit:   06/30/16 Last Filled:   Tramadol  240 tablet 0 07/28/2016  Last Filled:   Tizanidine  30 tablet 0 07/28/2016  Last Filled:   Hydrocodone  45 tablet 0 07/28/2016  Please advise.

## 2016-08-30 NOTE — Telephone Encounter (Signed)
I need this printed and signed by another MD since I am out of town.   I appreciate everyone's help.  This rx set is expected and typical, he is due for 1 month supply.  Thanks.

## 2016-08-31 MED ORDER — TRAMADOL HCL 50 MG PO TABS: 100.0000 mg | ORAL_TABLET | Freq: Four times a day (QID) | ORAL | 0 refills | Status: DC

## 2016-08-31 MED ORDER — HYDROCODONE-ACETAMINOPHEN 10-325 MG PO TABS: ORAL_TABLET | ORAL | 0 refills | Status: DC

## 2016-08-31 MED ORDER — TIZANIDINE HCL 4 MG PO TABS: ORAL_TABLET | ORAL | 0 refills | Status: DC

## 2016-08-31 NOTE — Telephone Encounter (Signed)
Pt left v/m; pt request to pick up rx for hydrocodone apap, tizanidine and tramadol today, 08/31/16.Pt is out of medication.

## 2016-08-31 NOTE — Telephone Encounter (Signed)
Rx printed and in CMA box.  

## 2016-08-31 NOTE — Telephone Encounter (Signed)
Patient advised.  Rx left at front desk for pick up. 

## 2016-09-27 ENCOUNTER — Other Ambulatory Visit: Payer: Self-pay | Admitting: Family Medicine

## 2016-09-27 DIAGNOSIS — M549 Dorsalgia, unspecified: Secondary | ICD-10-CM

## 2016-09-27 NOTE — Telephone Encounter (Signed)
MyChart Refill Request: Last office visit:   06/30/16 Tizanidine Last Filled:    30 tablet 0 08/31/2016  Tramadol Last Filled:    240 tablet 0 08/31/2016  Last Filled:   Hydrocodone 45 tablet 0 08/31/2016  Please advise.

## 2016-09-28 MED ORDER — HYDROCODONE-ACETAMINOPHEN 10-325 MG PO TABS: ORAL_TABLET | ORAL | 0 refills | Status: DC

## 2016-09-28 MED ORDER — TIZANIDINE HCL 4 MG PO TABS: ORAL_TABLET | ORAL | 0 refills | Status: DC

## 2016-09-28 MED ORDER — TRAMADOL HCL 50 MG PO TABS: 100.0000 mg | ORAL_TABLET | Freq: Four times a day (QID) | ORAL | 0 refills | Status: DC

## 2016-09-28 NOTE — Telephone Encounter (Signed)
Patient advised.  Rx left at front desk for pick up. 

## 2016-09-28 NOTE — Telephone Encounter (Signed)
Printed.  Thanks.  

## 2016-10-24 ENCOUNTER — Other Ambulatory Visit: Payer: Self-pay | Admitting: Family Medicine

## 2016-10-24 DIAGNOSIS — M549 Dorsalgia, unspecified: Secondary | ICD-10-CM

## 2016-10-25 NOTE — Telephone Encounter (Signed)
MyChart RF request: Hydrocodone Last Filled:    45 tablet 0 09/28/2016  Tizanidine Last Filled:    30 tablet 0 09/28/2016  Tramadol Last Filled:    240 tablet 0 09/28/2016  Last office visit:   06/30/16 Please advise.

## 2016-10-26 ENCOUNTER — Encounter: Payer: Self-pay | Admitting: Family Medicine

## 2016-10-26 ENCOUNTER — Ambulatory Visit (INDEPENDENT_AMBULATORY_CARE_PROVIDER_SITE_OTHER): Payer: BLUE CROSS/BLUE SHIELD | Admitting: Family Medicine

## 2016-10-26 VITALS — BP 116/70 | HR 77 | Temp 97.7°F | Wt 195.0 lb

## 2016-10-26 DIAGNOSIS — M7020 Olecranon bursitis, unspecified elbow: Secondary | ICD-10-CM

## 2016-10-26 MED ORDER — TIZANIDINE HCL 4 MG PO TABS: ORAL_TABLET | ORAL | 0 refills | Status: DC

## 2016-10-26 MED ORDER — HYDROCODONE-ACETAMINOPHEN 10-325 MG PO TABS: ORAL_TABLET | ORAL | 0 refills | Status: DC

## 2016-10-26 MED ORDER — TRAMADOL HCL 50 MG PO TABS: 100.0000 mg | ORAL_TABLET | Freq: Four times a day (QID) | ORAL | 0 refills | Status: DC

## 2016-10-26 NOTE — Patient Instructions (Signed)
Keep the area padded, take aleve with food and ice the area for 5 minutes at a time.  Take care.  Glad to see you.  If fever or drainage then update us.   Update me as needed.

## 2016-10-26 NOTE — Progress Notes (Signed)
R elbow lump.  Noted about 1 week ago  Puffy and tender.  He was getting up and put pressure on the elbow and then noted pain.  No clear injury initially.  Normal ROM.  Not playing golf recently.  No fevers.    His mother had a cerebral aneurysm and required surgery.  Patient was asking about screening.  I told him I would check on this.    rxs for pain meds handed to patient at OV.  No adverse effect on medication. Still stretching. Pain is tolerable. He is still at his baseline in terms of his functional status, able to be active with current medications.  Meds, vitals, and allergies reviewed.   ROS: Per HPI unless specifically indicated in ROS section   nad ncat Normal range of motion right shoulder elbow and wrist. No pain with supination or pronation of the right forearm. He does have a mildly inflamed olecranon bursa noted, only mildly tender. This does not appear infected. There is no spreading erythema.

## 2016-10-26 NOTE — Telephone Encounter (Signed)
Printed.  Thanks.  

## 2016-10-26 NOTE — Telephone Encounter (Signed)
Written prescriptions given to patient at office visit by Dr. Para Marchuncan.

## 2016-10-27 DIAGNOSIS — M7020 Olecranon bursitis, unspecified elbow: Secondary | ICD-10-CM | POA: Insufficient documentation

## 2016-10-27 NOTE — Assessment & Plan Note (Signed)
At this point he has mild symptoms. I put several 4 x 4's over the area and wrapped in Coban and just to make a temporary cushion. He felt better with this. This does not look infected. Given that the area of inflammation is small, he would likely not benefit from aspiration and the risk of infection of related aspiration probably outweighs the benefit. Discussed with patient. Continue NSAIDs, cushion the area, try not to bump it, ice as needed. Update me as needed. He agrees.

## 2016-10-31 ENCOUNTER — Telehealth: Payer: Self-pay | Admitting: Family Medicine

## 2016-10-31 NOTE — Telephone Encounter (Signed)
Please notify patient. He reported that his mother had a cerebral aneurysm. He was asking about screening for this. I looked at the guidelines. Screening is encouraged for patients who have two relatives who had a cerebral aneurysm.  The likelihood of finding a significant aneurysm is lower in patients who only have one family member with an aneurysm. Please verify and see how many family members he had with an aneurysm. If it was only one relative then the case for screening is much weaker. Thanks.

## 2016-11-01 NOTE — Telephone Encounter (Signed)
Left detailed message on voicemail.  

## 2016-11-22 ENCOUNTER — Other Ambulatory Visit: Payer: Self-pay | Admitting: Family Medicine

## 2016-11-22 DIAGNOSIS — M549 Dorsalgia, unspecified: Secondary | ICD-10-CM

## 2016-11-22 NOTE — Telephone Encounter (Signed)
MyChart RF request:   Last office visit:   10/26/2016 Last Filled:   Hydrocodone 45 tablet 0 10/26/2016  Last Filled:   Tizanidine 30 tablet 0 10/26/2016  Last Filled:   Tramadol  240 tablet 0 10/26/2016  Please advise.

## 2016-11-23 MED ORDER — HYDROCODONE-ACETAMINOPHEN 10-325 MG PO TABS: ORAL_TABLET | ORAL | 0 refills | Status: DC

## 2016-11-23 MED ORDER — TIZANIDINE HCL 4 MG PO TABS: ORAL_TABLET | ORAL | 0 refills | Status: DC

## 2016-11-23 MED ORDER — TRAMADOL HCL 50 MG PO TABS: 100.0000 mg | ORAL_TABLET | Freq: Four times a day (QID) | ORAL | 0 refills | Status: DC

## 2016-11-23 NOTE — Telephone Encounter (Signed)
Printed.  Thanks.  Prev reviewed [place] database on prior refills but unable to access site on mult attempts currently.

## 2016-11-23 NOTE — Telephone Encounter (Signed)
Patient advised.  Rx left at front desk for pick up. 

## 2016-12-21 ENCOUNTER — Other Ambulatory Visit: Payer: Self-pay | Admitting: Family Medicine

## 2016-12-21 DIAGNOSIS — M549 Dorsalgia, unspecified: Secondary | ICD-10-CM

## 2016-12-21 NOTE — Telephone Encounter (Signed)
Electronic refill request.  Last office visit:   10/26/16 Last Filled:   Hydrocodone 45 tablet 0 11/23/2016  Last Filled:   Tizanidine 30 tablet 0 11/23/2016  Last Filled:   Tramadol 240 tablet 0 11/23/2016  Please advise.

## 2016-12-22 MED ORDER — HYDROCODONE-ACETAMINOPHEN 10-325 MG PO TABS: ORAL_TABLET | ORAL | 0 refills | Status: DC

## 2016-12-22 MED ORDER — TRAMADOL HCL 50 MG PO TABS: 100.0000 mg | ORAL_TABLET | Freq: Four times a day (QID) | ORAL | 0 refills | Status: DC

## 2016-12-22 MED ORDER — TIZANIDINE HCL 4 MG PO TABS: ORAL_TABLET | ORAL | 0 refills | Status: DC

## 2016-12-22 NOTE — Telephone Encounter (Signed)
Patient notified by telephone that scripts are up front ready for pickup. 

## 2016-12-22 NOTE — Telephone Encounter (Signed)
Printed all 3.  Thanks.

## 2017-01-17 ENCOUNTER — Other Ambulatory Visit: Payer: Self-pay | Admitting: Family Medicine

## 2017-01-17 DIAGNOSIS — G8929 Other chronic pain: Secondary | ICD-10-CM

## 2017-01-17 DIAGNOSIS — M549 Dorsalgia, unspecified: Secondary | ICD-10-CM

## 2017-01-17 NOTE — Telephone Encounter (Signed)
MyChart Refill Request Last office visit:   10/26/2016 Last Filled:   Hydrocodone 45 tablet 0 12/22/2016  Last Filled:   Tizanidine 30 tablet 0 12/22/2016  Last Filled:   Tramadol 240 tablet 0 12/22/2016  Please advise.

## 2017-01-18 ENCOUNTER — Encounter: Payer: Self-pay | Admitting: *Deleted

## 2017-01-18 MED ORDER — HYDROCODONE-ACETAMINOPHEN 10-325 MG PO TABS: ORAL_TABLET | ORAL | 0 refills | Status: DC

## 2017-01-18 MED ORDER — TIZANIDINE HCL 4 MG PO TABS: ORAL_TABLET | ORAL | 0 refills | Status: DC

## 2017-01-18 MED ORDER — TRAMADOL HCL 50 MG PO TABS: 100.0000 mg | ORAL_TABLET | Freq: Four times a day (QID) | ORAL | 0 refills | Status: DC

## 2017-01-18 NOTE — Telephone Encounter (Signed)
Printed.  Thanks.  

## 2017-02-14 ENCOUNTER — Other Ambulatory Visit: Payer: Self-pay | Admitting: Family Medicine

## 2017-02-14 DIAGNOSIS — M549 Dorsalgia, unspecified: Secondary | ICD-10-CM

## 2017-02-14 NOTE — Telephone Encounter (Signed)
MyChart RF requests: Last office visit:   10/26/16 Last Filled:   Hydrocodone 45 tablet 0 01/18/2017  Last Filled:   Tizanidine 30 tablet 0 01/18/2017  Last Filled:   Tramadol 240 tablet 0 01/18/2017  Please advise.

## 2017-02-15 MED ORDER — TRAMADOL HCL 50 MG PO TABS: 100.0000 mg | ORAL_TABLET | Freq: Four times a day (QID) | ORAL | 0 refills | Status: DC

## 2017-02-15 MED ORDER — TIZANIDINE HCL 4 MG PO TABS: ORAL_TABLET | ORAL | 0 refills | Status: DC

## 2017-02-15 MED ORDER — HYDROCODONE-ACETAMINOPHEN 10-325 MG PO TABS: ORAL_TABLET | ORAL | 0 refills | Status: DC

## 2017-02-15 NOTE — Telephone Encounter (Signed)
Needs routine opiate f/u OV scheduled for next set of rxs after this one.  Thanks.   Printed.

## 2017-02-15 NOTE — Telephone Encounter (Signed)
Patient advised.  Rx left at front desk for pick up. 

## 2017-03-17 ENCOUNTER — Other Ambulatory Visit: Payer: Self-pay | Admitting: Family Medicine

## 2017-03-17 DIAGNOSIS — M549 Dorsalgia, unspecified: Secondary | ICD-10-CM

## 2017-03-17 NOTE — Telephone Encounter (Signed)
Copied from CRM [phone]. Topic: Quick Communication - See Telephone Encounter >> Mar 17, 2017  1:13 PM Cipriano BunkerLambe, Annette S wrote: CRM for notification. See Telephone encounter for:  Patient has an appt. On Monday 17 with Dr. Para Marchuncan,  but he is asking if can get a few days of prescription, he will be out on Friday.  [phone] - [place], [place] - [address] [facility] [address] [place] [id number] Phone: [phone] Fax: [phone]  Prescription for Norco and Tremodol, 2 days is what he needs until appt.   03/17/17.

## 2017-03-18 ENCOUNTER — Encounter: Payer: Self-pay | Admitting: Family Medicine

## 2017-03-18 ENCOUNTER — Ambulatory Visit (INDEPENDENT_AMBULATORY_CARE_PROVIDER_SITE_OTHER): Payer: BLUE CROSS/BLUE SHIELD | Admitting: Family Medicine

## 2017-03-18 ENCOUNTER — Encounter: Payer: Self-pay | Admitting: Radiology

## 2017-03-18 VITALS — BP 120/72 | HR 63 | Temp 97.5°F | Wt 201.0 lb

## 2017-03-18 DIAGNOSIS — Z23 Encounter for immunization: Secondary | ICD-10-CM

## 2017-03-18 DIAGNOSIS — G8929 Other chronic pain: Secondary | ICD-10-CM | POA: Diagnosis not present

## 2017-03-18 DIAGNOSIS — M545 Low back pain: Secondary | ICD-10-CM | POA: Diagnosis not present

## 2017-03-18 DIAGNOSIS — F1190 Opioid use, unspecified, uncomplicated: Secondary | ICD-10-CM

## 2017-03-18 DIAGNOSIS — Z0289 Encounter for other administrative examinations: Secondary | ICD-10-CM

## 2017-03-18 DIAGNOSIS — M549 Dorsalgia, unspecified: Secondary | ICD-10-CM

## 2017-03-18 MED ORDER — HYDROCODONE-ACETAMINOPHEN 10-325 MG PO TABS: ORAL_TABLET | ORAL | 0 refills | Status: DC

## 2017-03-18 MED ORDER — TIZANIDINE HCL 4 MG PO TABS: ORAL_TABLET | ORAL | 5 refills | Status: DC

## 2017-03-18 MED ORDER — TRAMADOL HCL 50 MG PO TABS: 100.0000 mg | ORAL_TABLET | Freq: Four times a day (QID) | ORAL | 0 refills | Status: DC

## 2017-03-18 NOTE — Progress Notes (Signed)
Indication for chronic opioid: chronic back pain Medication and dose: hydrocodone 10/325, 1.5 tabs a day # pills per month: 45 Last UDS date: today.   Pain contract signed (Y/N): yes Date narcotic database last reviewed (include red flags):  03/18/17.    Pain inventory (1-10) Average pain:  In the AM usually 7/10, later in the day 4/10, depending on the day Pain now: 5/10 My pain is: sharp, esp in the AM Pain is worse in the AM prior to med use.   Relief from meds: yes  Stretching frequently in the Am and Pm with relief.    In the last 24 hours, how much has pain interfered with the following (1-10 greatest interference)? General activity- he is able to plan around most activities, he tries to compensate.  He can't golf as much.   Relationships with others- still with relationships intact Enjoyment of life- he has "adjusted" to the limitations.   What time of the day is the pain the worst: AM.    Mobility/function Assistance device:no How many minutes can you walk: as needed, with med use.    Able to climb steps:yes Driving:yes Disabled:no  Bowel or bladder symptoms:no Mood: stable, d/w pt.   Physicians involved in care: no others.  Any changes since last visit?no  Meds, vitals, and allergies reviewed.   ROS: Per HPI unless specifically indicated in ROS section   GEN: nad, alert and oriented HEENT: mucous membranes moist NECK: supple w/o LA CV: rrr PULM: ctab, no inc wob ABD: soft, +bs EXT: no edema SKIN: no acute rash Lower back muscles are tender/tight bilaterally without bruising or rash.  Old scar noted in the lower lumbar spine, in the midline.  Distally grossly neurovascularly intact in the BLE.   Flu shot done today.

## 2017-03-18 NOTE — Telephone Encounter (Signed)
Pt requesting rx hydrocodone apap(last printed # 45 on 02/15/17) and tramadol (last printed # 240 on 02/15/17);last seen 10/26/16 and last UDS 08/14/15. Pt has appt on 03/21/17 but will be out of med 03/18/17.. Call when ready for pick up.

## 2017-03-18 NOTE — Telephone Encounter (Signed)
Left detailed message on voicemail. Rx left at front desk for pick up.  

## 2017-03-18 NOTE — Patient Instructions (Signed)
Go to the lab on the way out.  We'll contact you with your lab report. Recheck in 3 months for a pain management visit.  Take care.  Glad to see you.  Update me as needed.

## 2017-03-18 NOTE — Telephone Encounter (Signed)
Controlled medications - pt. Saw Dr. Para Marchuncan 10/26/16. Thanks.

## 2017-03-18 NOTE — Telephone Encounter (Signed)
Printed.  Does he want to come [facility] and have the routine pain med check up?  It may save him a trip.  Okay to add on.  Thanks.

## 2017-03-20 DIAGNOSIS — Z0289 Encounter for other administrative examinations: Secondary | ICD-10-CM | POA: Insufficient documentation

## 2017-03-20 DIAGNOSIS — F1190 Opioid use, unspecified, uncomplicated: Secondary | ICD-10-CM | POA: Insufficient documentation

## 2017-03-20 NOTE — Assessment & Plan Note (Signed)
Indication for chronic opioid: chronic back pain Medication and dose: hydrocodone 10/325, 1.5 tabs a day # pills per month: 45 Last UDS date: today.   Pain contract signed (Y/N): yes Date narcotic database last reviewed (include red flags):  03/18/17.    Reasonable to continue current medication, continue stretching.  No adverse effect of medication.  He agrees to update me as needed.  I had initially printed 3 pairs of prescriptions, for tramadol and hydrocodone.  The patient and I talked about this.  He wants to keep getting each pair of prescriptions at once a month.  He did not want to have to hold on to the other postdated pairs of prescriptions.  We can still plan on office visits every 3 months but he will pick up his monthly prescription otherwise.  I shredded the other 2 pairs of prescriptions and this was witnessed by Dr. Eustaquio BoydenJavier Gutierrez.

## 2017-03-21 ENCOUNTER — Ambulatory Visit: Payer: BLUE CROSS/BLUE SHIELD | Admitting: Family Medicine

## 2017-03-23 LAB — PAIN MGMT, PROFILE 8 W/CONF, U
6 Acetylmorphine: NEGATIVE ng/mL (ref <10)
ALCOHOL METABOLITES: NEGATIVE ng/mL (ref <500)
Amphetamines: NEGATIVE ng/mL (ref <500)
BENZODIAZEPINES: NEGATIVE ng/mL (ref <100)
Buprenorphine, Urine: NEGATIVE ng/mL (ref <5)
COCAINE METABOLITE: NEGATIVE ng/mL (ref <150)
CREATININE: 19.7 mg/dL — AB
Codeine: NEGATIVE ng/mL (ref <50)
HYDROMORPHONE: NEGATIVE ng/mL (ref <50)
Hydrocodone: 240 ng/mL — ABNORMAL HIGH (ref <50)
MDMA: NEGATIVE ng/mL (ref <500)
MORPHINE: NEGATIVE ng/mL (ref <50)
Marijuana Metabolite: NEGATIVE ng/mL (ref <20)
Norhydrocodone: 403 ng/mL — ABNORMAL HIGH (ref <50)
OPIATES: POSITIVE ng/mL — AB (ref <100)
Oxidant: NEGATIVE ug/mL (ref <200)
Oxycodone: NEGATIVE ng/mL (ref <100)
PH: 5.72 (ref 4.5–9.0)
SPECIFIC GRAVITY: 1.003 (ref >=1.0)

## 2017-04-13 ENCOUNTER — Encounter: Payer: Self-pay | Admitting: Family Medicine

## 2017-04-13 ENCOUNTER — Other Ambulatory Visit: Payer: Self-pay | Admitting: Family Medicine

## 2017-04-13 DIAGNOSIS — M549 Dorsalgia, unspecified: Secondary | ICD-10-CM

## 2017-04-14 ENCOUNTER — Other Ambulatory Visit: Payer: Self-pay | Admitting: Family Medicine

## 2017-04-14 DIAGNOSIS — M549 Dorsalgia, unspecified: Secondary | ICD-10-CM

## 2017-04-14 NOTE — Telephone Encounter (Signed)
MyChart RF request:  Tramadol Last office visit:   03/18/17 Last Filled:    240 tablet 0 03/18/2017  Last Filled:   Hydrocodone 45 tablet 0 03/18/2017  Please advise.

## 2017-04-15 MED ORDER — HYDROCODONE-ACETAMINOPHEN 10-325 MG PO TABS: ORAL_TABLET | ORAL | 0 refills | Status: DC

## 2017-04-15 MED ORDER — TRAMADOL HCL 50 MG PO TABS: 100.0000 mg | ORAL_TABLET | Freq: Four times a day (QID) | ORAL | 0 refills | Status: DC

## 2017-04-15 NOTE — Telephone Encounter (Signed)
Printed.  Thanks.  

## 2017-04-15 NOTE — Telephone Encounter (Signed)
Patient advised.  Rx left at front desk for pick up. 

## 2017-05-12 ENCOUNTER — Other Ambulatory Visit: Payer: Self-pay | Admitting: Family Medicine

## 2017-05-12 DIAGNOSIS — G8929 Other chronic pain: Secondary | ICD-10-CM

## 2017-05-12 DIAGNOSIS — M549 Dorsalgia, unspecified: Secondary | ICD-10-CM

## 2017-05-12 NOTE — Telephone Encounter (Signed)
Electronic refill request.  Last office visit:   03/18/2017 Last Filled:   Tramadol 240 tablet 0 04/15/2017  Last Filled:   Hydrocodone 45 tablet 0 04/15/2017  Please advise.

## 2017-05-13 MED ORDER — HYDROCODONE-ACETAMINOPHEN 10-325 MG PO TABS: ORAL_TABLET | ORAL | 0 refills | Status: DC

## 2017-05-13 MED ORDER — TRAMADOL HCL 50 MG PO TABS: 100.0000 mg | ORAL_TABLET | Freq: Four times a day (QID) | ORAL | 0 refills | Status: DC

## 2017-05-13 NOTE — Telephone Encounter (Signed)
Both sent.  Thanks.    Indication for chronic opioid: chronic back pain Medication and dose: hydrocodone 10/325, 1.5 tabs a day # pills per month: 45 Last UDS date: 03/20/17.   Pain contract signed (Y/N): yes Date narcotic database last reviewed (include red flags):  03/18/17.

## 2017-06-06 ENCOUNTER — Other Ambulatory Visit: Payer: Self-pay | Admitting: Family Medicine

## 2017-06-06 DIAGNOSIS — M549 Dorsalgia, unspecified: Secondary | ICD-10-CM

## 2017-06-06 MED ORDER — HYDROCODONE-ACETAMINOPHEN 10-325 MG PO TABS: ORAL_TABLET | ORAL | 0 refills | Status: DC

## 2017-06-06 MED ORDER — TRAMADOL HCL 50 MG PO TABS: 100.0000 mg | ORAL_TABLET | Freq: Four times a day (QID) | ORAL | 0 refills | Status: DC

## 2017-06-06 NOTE — Telephone Encounter (Signed)
Last office visit 03/18/17 Hydrocodone last refill 05/13/17 $45 Tramadol last refill 05/13/17 #240

## 2017-06-06 NOTE — Telephone Encounter (Signed)
Sent. Thanks.  Due for OV with next refill request per protocol.

## 2017-06-07 NOTE — Telephone Encounter (Signed)
Left detailed message on voicemail to schedule appointment.  °

## 2017-07-03 ENCOUNTER — Other Ambulatory Visit: Payer: Self-pay | Admitting: Family Medicine

## 2017-07-03 DIAGNOSIS — M549 Dorsalgia, unspecified: Secondary | ICD-10-CM

## 2017-07-03 DIAGNOSIS — G8929 Other chronic pain: Secondary | ICD-10-CM

## 2017-07-04 ENCOUNTER — Other Ambulatory Visit: Payer: Self-pay | Admitting: Family Medicine

## 2017-07-04 DIAGNOSIS — M549 Dorsalgia, unspecified: Secondary | ICD-10-CM

## 2017-07-04 MED ORDER — TRAMADOL HCL 50 MG PO TABS: 100.0000 mg | ORAL_TABLET | Freq: Four times a day (QID) | ORAL | 0 refills | Status: DC

## 2017-07-04 MED ORDER — HYDROCODONE-ACETAMINOPHEN 10-325 MG PO TABS: ORAL_TABLET | ORAL | 0 refills | Status: DC

## 2017-07-04 NOTE — Telephone Encounter (Signed)
Electronic refill request Last office visit 03/05/17 Hydrocodone last refill 06/06/17 #45 Tramadol last refill 06/06/17 # 240

## 2017-07-04 NOTE — Telephone Encounter (Signed)
Sent.   Needs a pain management visit scheduled.  Thanks.

## 2017-07-06 NOTE — Telephone Encounter (Signed)
Called and LMTCB to schedule appt-[name] V Davinia Riccardi, RMA

## 2017-07-18 NOTE — Telephone Encounter (Signed)
Sent mychart message today to the [facility]patient-[name] V [place], RMA

## 2017-07-19 NOTE — Telephone Encounter (Signed)
appt made-Crucita Lacorte V Kaemon Barnett, RMA  

## 2017-07-21 ENCOUNTER — Ambulatory Visit: Payer: BLUE CROSS/BLUE SHIELD | Admitting: Family Medicine

## 2017-07-21 ENCOUNTER — Encounter: Payer: Self-pay | Admitting: Family Medicine

## 2017-07-21 DIAGNOSIS — G8929 Other chronic pain: Secondary | ICD-10-CM

## 2017-07-21 NOTE — Patient Instructions (Signed)
Don't change your meds for now.  Let me see about options re: sleep and we'll be in touch.  Take care.  Glad to see you.

## 2017-07-21 NOTE — Progress Notes (Signed)
Indication for chronic opioid: chronic back pain Medication and dose: hydrocodone 10/325, 1.5 tabs a day # pills per month: 45 Last UDS date: 03/20/17.   Pain contract signed (Y/N): yes Date narcotic database last reviewed (include red flags):  07/04/17   Pain inventory (1-10) Average pain: 5.5-6/10 Pain now: 5.5-6/10 My pain is constant Pain is worse with movement/activity above baseline, esp bending and lifting.   Relief from meds:yes, partial.    In the last 24 hours, how much has pain interfered with the following (1-10 greatest interference)? General activity- he can't golf now Relationships with others- some, he is able to tolerate as is Enjoyment of life- see above What time of the day is the pain the worst: ~noon daily.   Sleep is described by patient as "bad."  Taking tizanidine to help sleep. He is still waking at night due to pain/nightmares.  This is at baseline, has been going on for years.  Not snoring at night.   Mobility/function Assistance device: no How many minutes can you walk: as needed, with pain.   Able to climb steps:yes Driving:yes Disabled:no  Bowel or bladder symptoms: no  Mood: irritable with pain, but better with pain relief.    Physicians involved in care: no Any changes since last visit? No  He wants me to continue erx'ing his meds months, not to do a set of 3 months printed.    He is still stretching at baseline.    Meds, vitals, and allergies reviewed.   ROS: Per HPI unless specifically indicated in ROS section   GEN: nad, alert and oriented HEENT: mucous membranes moist NECK: supple w/o LA CV: rrr PULM: ctab, no inc wob ABD: soft, +bs EXT: no edema SKIN: no acute rash ttp just lateral to midline back scar S/S wnl BLE

## 2017-07-24 ENCOUNTER — Telehealth: Payer: Self-pay | Admitting: Family Medicine

## 2017-07-24 NOTE — Telephone Encounter (Signed)
Call patient.  I gave his situation consideration.  I think the best option to help with his sleep at night is to have him see Dr. Roderic OvensNorth back at the neurosurgery/pain clinic, to see what they have to offer.  I think that is likely the best option to try to improve his pain at this point and therefore improve his sleep.  Please let me know if he needs help getting an appointment set up.  I would not change his medicines otherwise in the meantime.  Thanks.

## 2017-07-24 NOTE — Assessment & Plan Note (Signed)
Indication for chronic opioid: chronic back pain Medication and dose: hydrocodone 10/325, 1.5 tabs a day # pills per month: 45 Last UDS date: 03/20/17.   Pain contract signed (Y/N): yes Date narcotic database last reviewed (include red flags):  07/04/17   He had seen Dr. Roderic OvensNorth previously.  The patient is not sleeping well at night and this is likely exacerbated by his pain.  He has been compliant with his baseline medications and he has not had escalation of dose in a long time.  The question the patient had was what could be done to most improve his sleep.  He is tried using tizanidine at night.  I wanted to give this consideration.  See following phone note.  No change in medications at this point.  Okay for outpatient follow-up.  He agreed with the plan.  >15 minutes spent in face to face time with patient, >50% spent in counselling or coordination of care.

## 2017-07-25 NOTE — Telephone Encounter (Signed)
Left detailed message on voicemail.  

## 2017-07-29 ENCOUNTER — Other Ambulatory Visit: Payer: Self-pay | Admitting: Family Medicine

## 2017-07-29 DIAGNOSIS — M549 Dorsalgia, unspecified: Secondary | ICD-10-CM

## 2017-07-29 NOTE — Telephone Encounter (Signed)
Electronic refill request.  Last office visit:   07/21/2017 Last Filled:   Tramadol 240 tablet 0 07/04/2017  Last Filled:   Hydrocodone 45 tablet 0 07/04/2017  Please advise.

## 2017-07-29 NOTE — Telephone Encounter (Signed)
  Pt requesting refill Hydrocodone apap; Last refilled and qty; #45 on 07/04/2017 Last seen:07/21/17 [facility] [place] Last UDS:03/18/17  Pt requesting refill tramadol; Last refilled and qty; # 240 on 07/04/2017 Last seen 07/21/17 [facility] [place] Last UDS: 03/18/17

## 2017-07-31 MED ORDER — HYDROCODONE-ACETAMINOPHEN 10-325 MG PO TABS: ORAL_TABLET | ORAL | 0 refills | Status: DC

## 2017-07-31 MED ORDER — TRAMADOL HCL 50 MG PO TABS: 100.0000 mg | ORAL_TABLET | Freq: Four times a day (QID) | ORAL | 0 refills | Status: DC

## 2017-07-31 NOTE — Telephone Encounter (Signed)
Sent. Thanks.   

## 2017-08-28 ENCOUNTER — Other Ambulatory Visit: Payer: Self-pay | Admitting: Family Medicine

## 2017-08-28 DIAGNOSIS — M549 Dorsalgia, unspecified: Secondary | ICD-10-CM

## 2017-08-30 NOTE — Telephone Encounter (Signed)
Last office visit 07/21/17 Last refill Hydrocodone 07/31/17 #45 Last refill Tramadol 07/31/17 #240

## 2017-08-31 MED ORDER — HYDROCODONE-ACETAMINOPHEN 10-325 MG PO TABS: ORAL_TABLET | ORAL | 0 refills | Status: DC

## 2017-08-31 MED ORDER — TRAMADOL HCL 50 MG PO TABS: 100.0000 mg | ORAL_TABLET | Freq: Four times a day (QID) | ORAL | 0 refills | Status: DC

## 2017-08-31 NOTE — Telephone Encounter (Signed)
Sent. Thanks.   

## 2017-09-05 ENCOUNTER — Telehealth: Payer: Self-pay

## 2017-09-05 NOTE — Telephone Encounter (Signed)
Copied from CRM [phone]. Topic: General - Other >> Sep 05, 2017  4:17 PM Windy KalataMichael, Taylor L, NT wrote: Reason for CRM: patient is calling to cancel appt for 09/06/17 due to his father passing away. He states he would like Dr. Para Marchuncan to give him a call.

## 2017-09-05 NOTE — Telephone Encounter (Signed)
Called pt.  Per patient report, his father was recently dx'd with esophageal cancer and then died a few weeks later.  He was asking about options.  He'll reschedule his CPE that was planned for tomorrow. I thought it was reasonable to get through the service for his father tomorrow and then we can discuss at f/u CPE, whenever that is.  Patient agrees and thanked me. I offered my condolences.    His appointment tomorrow appears to be cancelled already.

## 2017-09-06 ENCOUNTER — Encounter: Payer: BLUE CROSS/BLUE SHIELD | Admitting: Family Medicine

## 2017-09-22 ENCOUNTER — Ambulatory Visit: Payer: BLUE CROSS/BLUE SHIELD | Admitting: Family Medicine

## 2017-09-22 ENCOUNTER — Encounter: Payer: Self-pay | Admitting: Family Medicine

## 2017-09-22 VITALS — BP 116/76 | HR 77 | Temp 97.6°F | Ht 74.0 in | Wt 201.5 lb

## 2017-09-22 DIAGNOSIS — R002 Palpitations: Secondary | ICD-10-CM | POA: Diagnosis not present

## 2017-09-22 LAB — COMPREHENSIVE METABOLIC PANEL
ALK PHOS: 61 U/L (ref 39–117)
ALT: 12 U/L (ref 0–53)
AST: 13 U/L (ref 0–37)
Albumin: 4.4 g/dL (ref 3.5–5.2)
BUN: 13 mg/dL (ref 6–23)
CALCIUM: 9.6 mg/dL (ref 8.4–10.5)
CO2: 32 mEq/L (ref 19–32)
Chloride: 103 mEq/L (ref 96–112)
Creatinine, Ser: 0.93 mg/dL (ref 0.40–1.50)
GFR: 94.73 mL/min (ref >60.00)
GLUCOSE: 91 mg/dL (ref 70–99)
POTASSIUM: 4.5 meq/L (ref 3.5–5.1)
Sodium: 140 mEq/L (ref 135–145)
TOTAL PROTEIN: 7.4 g/dL (ref 6.0–8.3)
Total Bilirubin: 0.4 mg/dL (ref 0.2–1.2)

## 2017-09-22 LAB — TSH: TSH: 1.03 u[IU]/mL (ref 0.35–4.50)

## 2017-09-22 LAB — CBC WITH DIFFERENTIAL/PLATELET
BASOS ABS: 0.1 10*3/uL (ref 0.0–0.1)
Basophils Relative: 1.3 % (ref 0.0–3.0)
EOS PCT: 10.8 % — AB (ref 0.0–5.0)
Eosinophils Absolute: 0.5 10*3/uL (ref 0.0–0.7)
HEMATOCRIT: 42.3 % (ref 39.0–52.0)
Hemoglobin: 14.4 g/dL (ref 13.0–17.0)
LYMPHS ABS: 1.2 10*3/uL (ref 0.7–4.0)
LYMPHS PCT: 24.9 % (ref 12.0–46.0)
MCHC: 34.1 g/dL (ref 30.0–36.0)
MCV: 89.6 fl (ref 78.0–100.0)
MONOS PCT: 7.9 % (ref 3.0–12.0)
Monocytes Absolute: 0.4 10*3/uL (ref 0.1–1.0)
NEUTROS PCT: 55.1 % (ref 43.0–77.0)
Neutro Abs: 2.7 10*3/uL (ref 1.4–7.7)
Platelets: 310 10*3/uL (ref 150.0–400.0)
RBC: 4.72 Mil/uL (ref 4.22–5.81)
RDW: 13.1 % (ref 11.5–15.5)
WBC: 4.9 10*3/uL (ref 4.0–10.5)

## 2017-09-22 MED ORDER — METOPROLOL TARTRATE 25 MG PO TABS: 12.5000 mg | ORAL_TABLET | Freq: Every day | ORAL | 0 refills | Status: DC | PRN

## 2017-09-22 NOTE — Patient Instructions (Signed)
Go to the lab on the way out.  We'll contact you with your lab report. Gradually taper caffeine.  Don't quit cold [place].  Take care.  Glad to see you.  We will call about your referral.  Shirlee LimerickMarion or Alvina Chounastasiya will call you if you don't see one of them on the way out.  If needed, take a 1/2 dose of metoprolol daily.

## 2017-09-22 NOTE — Progress Notes (Signed)
His father had passed and I offered my condolences.  His father dipped tobacco, unclear if that contributed.  At some point, it may be reasonable to get him set up with GI.  I would defer this for now, given the sx below.  Pt agrees.    He felt some racing, episodic.  He first noted it in the last few weeks.  He can feel "light in my chest, likely butterflies", a fluttering.  When he has sx, he can verify that is pulse is elevated but still regular.   3-5 servings of caffeine per day at baseline.  No LOC.  He can feel a little lightheaded.    He may have mult episodes per day, or not at all in a day.  Episodes last 1-2 minutes at a time.    Father with AF prev. sister with SVT  PMH and SH reviewed  ROS: Per HPI unless specifically indicated in ROS section   Meds, vitals, and allergies reviewed.   GEN: nad, alert and oriented HEENT: mucous membranes moist NECK: supple w/o LA CV: rrr.  no murmur PULM: ctab, no inc wob ABD: soft, +bs EXT: no edema SKIN: no acute rash

## 2017-09-23 NOTE — Assessment & Plan Note (Signed)
Likely from a combination of sources.  High stress recent events noted with the death of his father.  There is a family history of arrhythmia, father had A. fib and sister has SVT.  He uses caffeine daily. Gradually taper caffeine.  Don't quit cold [place].  Refer to cards.   If needed, take a 1/2 dose of metoprolol daily.   Discussed with patient about rationale for treatment. Check routine labs today. Okay for outpatient follow-up.  He agrees with plan. >25 minutes spent in face to face time with patient, >50% spent in counselling or coordination of care.

## 2017-09-25 ENCOUNTER — Other Ambulatory Visit: Payer: Self-pay | Admitting: Family Medicine

## 2017-09-25 DIAGNOSIS — M549 Dorsalgia, unspecified: Secondary | ICD-10-CM

## 2017-09-26 MED ORDER — TRAMADOL HCL 50 MG PO TABS: 100.0000 mg | ORAL_TABLET | Freq: Four times a day (QID) | ORAL | 0 refills | Status: DC

## 2017-09-26 MED ORDER — HYDROCODONE-ACETAMINOPHEN 10-325 MG PO TABS: ORAL_TABLET | ORAL | 0 refills | Status: DC

## 2017-09-26 NOTE — Telephone Encounter (Signed)
Sent. Thanks.   Indication for chronic opioid: chronic back pain Medication and dose: hydrocodone 10/325, 1.5 tabs a day # pills per month: 45 Last UDS date: 03/20/17.  Pain contract signed (Y/N): yes Date narcotic database last reviewed (include red flags): 07/04/17

## 2017-09-26 NOTE — Telephone Encounter (Signed)
Name of Medication: Hydrocodone Name of Pharmacy: [facility] Last Fill or Written Date and Quantity: 08/31/17 #45 Last Office Visit and Type: office visit palpitations Next Office Visit and Type: none scheduled Last Controlled Substance Agreement Date: 01/17/15 Last UDS:03/18/17 Tramadol last refill 08/31/17 #240

## 2017-09-28 ENCOUNTER — Ambulatory Visit: Payer: BLUE CROSS/BLUE SHIELD | Admitting: Cardiology

## 2017-10-05 ENCOUNTER — Ambulatory Visit (INDEPENDENT_AMBULATORY_CARE_PROVIDER_SITE_OTHER): Payer: BLUE CROSS/BLUE SHIELD | Admitting: Cardiology

## 2017-10-05 ENCOUNTER — Encounter: Payer: Self-pay | Admitting: Cardiology

## 2017-10-05 VITALS — BP 112/62 | HR 67 | Ht 74.0 in | Wt 201.0 lb

## 2017-10-05 DIAGNOSIS — R0789 Other chest pain: Secondary | ICD-10-CM

## 2017-10-05 DIAGNOSIS — E782 Mixed hyperlipidemia: Secondary | ICD-10-CM | POA: Diagnosis not present

## 2017-10-05 DIAGNOSIS — R002 Palpitations: Secondary | ICD-10-CM

## 2017-10-05 NOTE — Progress Notes (Signed)
Cardiology Office Note:    Date:  10/05/2017   ID:  [name], DOB 1975-08-14, MRN [id number]  PCP:  Joaquim Namuncan, Graham S, MD  Cardiologist:  Garwin Brothersajan R Livingston Denner, MD   Referring MD: Joaquim Namuncan, Graham S, MD    ASSESSMENT:    1. Palpitations   2. Mixed hyperlipidemia   3. Chest discomfort    PLAN:    In order of problems listed above:  1. Primary prevention stressed with the patient.  Importance of compliance with diet and medications stressed and he vocalized understanding.  His blood pressure is stable.  His lipids are significantly elevated and diet was discussed.  He does not have a great diet plan at this time and I went over this with him at length. 2. I reassured him about his palpitations.  His TSH is fine.  We will do a one-month event monitor to check for any recurrences. 3. In view of his risk factors for coronary artery disease he will have an exercise stress echo. 4. Patient will be seen in follow-up appointment in 3 months or earlier if the patient has any concerns 5. He knows to go to the nearest emergency room for any concerning symptoms.   Medication Adjustments/Labs and Tests Ordered: Current medicines are reviewed at length with the patient today.  Concerns regarding medicines are outlined above.  No orders of the defined types were placed in this encounter.  No orders of the defined types were placed in this encounter.    History of Present Illness:    [name] is a 42 y.o. male who is being seen today for the evaluation of palpitations at the request of Para MarchDuncan, Dwana CurdGraham S, MD.  Patient is a pleasant 42 year old male.  He has no significant past medical history.  He mentions to me that he has had palpitations on 2 occasions which were very significant and lasted a few minutes.  Therefore he saw his primary care provider who referred him here.  The patient has had no issues since the past 2 weeks.  When it happens he has some chest discomfort.  No orthopnea or PND.   No syncope or any such feelings.  At the time of my evaluation, the patient is alert awake oriented and in no distress.  He is an active gentleman but does not exercise on a regular basis.  Past Medical History:  Diagnosis Date  . Back pain, chronic    Previous pain clinic eval and treatment  . Chronic rhinitis   . Family history of malignant neoplasm of gastrointestinal tract   . Ganglion of joint   . H/O: substance abuse    sober for years as of 2011. Has been responsible with Rx meds, prev was on lexpapro but had done well for extened period of time off med as of 2013  . Insomnia, unspecified   . Rash and other nonspecific skin eruption     Past Surgical History:  Procedure Laterality Date  . back fusion     2 level    Current Medications: Current Meds  Medication Sig  . b complex vitamins tablet Take 1 tablet by mouth daily.  Marland Kitchen. glucosamine-chondroitin 500-400 MG tablet Take 1 tablet by mouth daily.  Marland Kitchen. HYDROcodone-acetaminophen (NORCO) 10-325 MG tablet Take up to 1.5 tabs per day.  . metoprolol tartrate (LOPRESSOR) 25 MG tablet Take 0.5 tablets (12.5 mg total) by mouth daily as needed.  . naproxen sodium (ANAPROX) 220 MG tablet Take 440 mg by  mouth every morning.   . Omega-3 Fatty Acids (FISH OIL) 1000 MG CAPS Take 1,000 mg by mouth daily.   Marland Kitchen tiZANidine (ZANAFLEX) 4 MG tablet TAKE ONE TABLET BY MOUTH EVERY NIGHT AT BEDTIME  . traMADol (ULTRAM) 50 MG tablet Take 2 tablets (100 mg total) by mouth every 6 (six) hours.     Allergies:   Patient has no known allergies.   Social History   Socioeconomic History  . Marital status: Married    Spouse name: Not on file  . Number of children: Not on file  . Years of education: Not on file  . Highest education level: Not on file  Occupational History  . Not on file  Social Needs  . Financial resource strain: Not on file  . Food insecurity:    Worry: Not on file    Inability: Not on file  . Transportation needs:    Medical:  Not on file    Non-medical: Not on file  Tobacco Use  . Smoking status: Never Smoker  . Smokeless tobacco: Never Used  Substance and Sexual Activity  . Alcohol use: No  . Drug use: No  . Sexual activity: Not on file  Lifestyle  . Physical activity:    Days per week: Not on file    Minutes per session: Not on file  . Stress: Not on file  Relationships  . Social connections:    Talks on phone: Not on file    Gets together: Not on file    Attends religious service: Not on file    Active member of club or organization: Not on file    Attends meetings of clubs or organizations: Not on file    Relationship status: Not on file  Other Topics Concern  . Not on file  Social History Narrative   Married   Regular exercise-yes   2 daughters     Family History: The patient's family history includes Arthritis in his father and mother; Atrial fibrillation in his father; Colon cancer in his unknown relative; Esophageal cancer in his father; Heart disease in his unknown relative; Hyperlipidemia in his father and mother; Hypertension in his father, mother, and unknown relative; Melanoma in his father; Supraventricular tachycardia in his sister. There is no history of Prostate cancer.  ROS:   Please see the history of present illness.    All other systems reviewed and are negative.  EKGs/Labs/Other Studies Reviewed:    The following studies were reviewed today: EKG reveals sinus rhythm and nonspecific ST-T changes.   Recent Labs: 09/22/2017: ALT 12; BUN 13; Creatinine, Ser 0.93; Hemoglobin 14.4; Platelets 310.0; Potassium 4.5; Sodium 140; TSH 1.03  Recent Lipid Panel    Component Value Date/Time   CHOL 250 (H) 06/30/2016 1816   TRIG 251.0 (H) 06/30/2016 1816   HDL 54.30 06/30/2016 1816   CHOLHDL 5 06/30/2016 1816   VLDL 50.2 (H) 06/30/2016 1816   LDLDIRECT 160.0 06/30/2016 1816    Physical Exam:    VS:  BP 112/62 (BP Location: Right Arm)   Pulse 67   Ht 6\' 2"  (1.88 m)   Wt 201  lb (91.2 kg)   SpO2 98%   BMI 25.81 kg/m     Wt Readings from Last 3 Encounters:  10/05/17 201 lb (91.2 kg)  09/22/17 201 lb 8 oz (91.4 kg)  07/21/17 201 lb 8 oz (91.4 kg)     GEN: Patient is in no acute distress HEENT: Normal NECK: No JVD; No carotid  bruits LYMPHATICS: No lymphadenopathy CARDIAC: S1 S2 regular, 2/6 systolic murmur at the apex. RESPIRATORY:  Clear to auscultation without rales, wheezing or rhonchi  ABDOMEN: Soft, non-tender, non-distended MUSCULOSKELETAL:  No edema; No deformity  SKIN: Warm and dry NEUROLOGIC:  Alert and oriented x 3 PSYCHIATRIC:  Normal affect    Signed, Garwin Brothers, MD  10/05/2017 3:20 PM    [facility]

## 2017-10-05 NOTE — Patient Instructions (Signed)
Medication Instructions:  Your physician recommends that you continue on your current medications as directed. Please refer to the Current Medication list given to you today.  Labwork: None ordered  Testing/Procedures: Your physician has requested that you have a stress echocardiogram. For further information please visit [email or web address]. Please follow instruction sheet as given.  Your physician has recommended that you wear a holter monitor. Holter monitors are medical devices that record the heart's electrical activity. Doctors most often use these monitors to diagnose arrhythmias. Arrhythmias are problems with the speed or rhythm of the heartbeat. The monitor is a small, portable device. You can wear one while you do your normal daily activities. This is usually used to diagnose what is causing palpitations/syncope (passing out). You will wear this 30 days  Follow-Up: Your physician recommends that you schedule a follow-up appointment in: 3 months with Dr. Tomie Chinaevankar   Any Other Special Instructions Will Be Listed Below (If Applicable).     If you need a refill on your cardiac medications before your next appointment, please call your pharmacy.

## 2017-10-11 NOTE — Addendum Note (Signed)
Addended by: Crist FatLOCKHART, CATHERINE P on: 10/11/2017 09:02 AM   Modules accepted: Orders

## 2017-10-23 ENCOUNTER — Other Ambulatory Visit: Payer: Self-pay | Admitting: Family Medicine

## 2017-10-23 DIAGNOSIS — G8929 Other chronic pain: Secondary | ICD-10-CM

## 2017-10-23 DIAGNOSIS — M549 Dorsalgia, unspecified: Secondary | ICD-10-CM

## 2017-10-24 MED ORDER — HYDROCODONE-ACETAMINOPHEN 10-325 MG PO TABS: ORAL_TABLET | ORAL | 0 refills | Status: DC

## 2017-10-24 MED ORDER — TRAMADOL HCL 50 MG PO TABS: 100.0000 mg | ORAL_TABLET | Freq: Four times a day (QID) | ORAL | 0 refills | Status: DC

## 2017-10-24 NOTE — Telephone Encounter (Signed)
Indication for chronic opioid: chronic back pain Medication and dose: hydrocodone 10/325, 1.5 tabs a day # pills per month: 45 Last UDS date: 03/20/17.  Pain contract signed (Y/N): yes Date narcotic database last reviewed (include red flags):  10/24/17  rx sent.  Thanks.

## 2017-10-24 NOTE — Telephone Encounter (Signed)
Name of Medication: Tramadol [facility] [place] Last Fill or Written Date and Quantity:  240 tablet 0 09/26/2017  Last Office Visit and Type: 09/22/17 Acute Next Office Visit and Type: None Last Controlled Substance Agreement Date: 01/17/15 Last UDS: 08/14/15  Name of Medication: Hydrocodone/acetaminophen [facility] [place] Last Fill or Written Date and Quantity:  45 tablet 0 09/26/2017  Last Office Visit and Type: 09/22/17 Acute Next Office Visit and Type: None Last Controlled Substance Agreement Date: 01/17/15 Last UDS: 08/14/15

## 2017-10-26 ENCOUNTER — Ambulatory Visit (INDEPENDENT_AMBULATORY_CARE_PROVIDER_SITE_OTHER): Payer: BLUE CROSS/BLUE SHIELD

## 2017-10-26 DIAGNOSIS — R0789 Other chest pain: Secondary | ICD-10-CM | POA: Diagnosis not present

## 2017-10-26 DIAGNOSIS — R002 Palpitations: Secondary | ICD-10-CM | POA: Diagnosis not present

## 2017-10-26 NOTE — Progress Notes (Signed)
Echocardiogram stress test with limited exam has been performed.   Jimmy Kristyl Athens RDCS 

## 2017-11-21 ENCOUNTER — Other Ambulatory Visit: Payer: Self-pay | Admitting: Family Medicine

## 2017-11-21 DIAGNOSIS — M549 Dorsalgia, unspecified: Secondary | ICD-10-CM

## 2017-11-22 ENCOUNTER — Other Ambulatory Visit: Payer: Self-pay | Admitting: *Deleted

## 2017-11-22 DIAGNOSIS — M549 Dorsalgia, unspecified: Secondary | ICD-10-CM

## 2017-11-22 NOTE — Telephone Encounter (Addendum)
Name of Medication: Hydrocodone Name of Pharmacy: [facility], [place] Last Fill or Written Date and Quantity:   45 tablet 0 10/24/2017  Last Office Visit and Type: 09/22/17 Acute Next Office Visit and Type: None Last Controlled Substance Agreement Date: 08/14/15 Last UDS: 08/14/15   Name of Medication: Tramadol Name of Pharmacy: [facility], [place] Last Fill or Written Date and Quantity:  240 tablet 0 10/24/2017  Last Office Visit and Type: 09/22/17 Acute Next Office Visit and Type: None Last Controlled Substance Agreement Date: 08/14/15 Last UDS: 08/14/15  Patient has moved to [place], [place] and will be using [facility] in [place] rather than previous pharmacy.  Patient will be keeping Dr. Para Marchuncan as PCP.

## 2017-11-23 MED ORDER — HYDROCODONE-ACETAMINOPHEN 10-325 MG PO TABS: ORAL_TABLET | ORAL | 0 refills | Status: DC

## 2017-11-23 MED ORDER — TRAMADOL HCL 50 MG PO TABS: 100.0000 mg | ORAL_TABLET | Freq: Four times a day (QID) | ORAL | 0 refills | Status: DC

## 2017-11-23 NOTE — Telephone Encounter (Signed)
Patient was notified. Rx was sent to pharmacy.  

## 2017-11-23 NOTE — Telephone Encounter (Signed)
Last UDS 03/18/17.  Sent. Thanks.

## 2017-12-20 ENCOUNTER — Other Ambulatory Visit: Payer: Self-pay | Admitting: Family Medicine

## 2017-12-20 DIAGNOSIS — M549 Dorsalgia, unspecified: Secondary | ICD-10-CM

## 2017-12-21 MED ORDER — HYDROCODONE-ACETAMINOPHEN 10-325 MG PO TABS: ORAL_TABLET | ORAL | 0 refills | Status: DC

## 2017-12-21 MED ORDER — TRAMADOL HCL 50 MG PO TABS: 100.0000 mg | ORAL_TABLET | Freq: Four times a day (QID) | ORAL | 0 refills | Status: DC

## 2017-12-21 NOTE — Telephone Encounter (Signed)
Name of Medication: Tramadol, Hydrocodone Name of Pharmacy: [facility] Last Fill or Written Date and Quantity: 11/23/17 Tramadol #240, Hydrocodone #45 Last Office Visit and Type: 10/05/17 palpitations Next Office Visit and Type:  None scheduled Last Controlled Substance Agreement Date: 01/17/15 Last UDS:03/18/17

## 2017-12-21 NOTE — Telephone Encounter (Signed)
Sent. Thanks.  With his move, is he going to est with a doc closer to home?  I'm glad to see him and I wish him and his family the best either way- I just want to make sure I am planned about his follow up either here or there.   Thanks.

## 2017-12-22 NOTE — Telephone Encounter (Signed)
Attempted to contact pt; unable to leave vm 

## 2018-01-20 ENCOUNTER — Other Ambulatory Visit: Payer: Self-pay | Admitting: Family Medicine

## 2018-01-20 DIAGNOSIS — M549 Dorsalgia, unspecified: Secondary | ICD-10-CM

## 2018-01-20 NOTE — Telephone Encounter (Signed)
Pharmacy requests refills on patient controlled substances:  1.  Hydrocodone-acetaminophen Last refill #45 on 12/21/17 Pharmacy: [facility] [place]   2.  Tramadol Last Refill #240 on 12/21/17 Pharmacy [facility] [place]  Last UDS: 03/20/17 Last Controlled Substance visit with PCP on 07/21/17 Pain Contract: Yes  Dr. Para March, please advise how we should proceed.  Appears patient is due for OV for chronic pain management for controlled substances.

## 2018-01-22 ENCOUNTER — Other Ambulatory Visit: Payer: Self-pay | Admitting: Family Medicine

## 2018-01-22 DIAGNOSIS — M549 Dorsalgia, unspecified: Secondary | ICD-10-CM

## 2018-01-22 NOTE — Telephone Encounter (Signed)
Check with patient.  With his move, is he going to est with a doc closer to home?  I'm glad to see him and I wish him and his family the best either way. Let me know if he is going to f/u here or at another clinic.   Thanks.

## 2018-01-23 MED ORDER — TRAMADOL HCL 50 MG PO TABS: 100.0000 mg | ORAL_TABLET | Freq: Four times a day (QID) | ORAL | 0 refills | Status: DC

## 2018-01-23 MED ORDER — HYDROCODONE-ACETAMINOPHEN 10-325 MG PO TABS: ORAL_TABLET | ORAL | 0 refills | Status: DC

## 2018-01-23 NOTE — Telephone Encounter (Signed)
Pt states that he plans to stay with Dr. Para March. He is very happy with the care he has received thus far with Dr. Para March. He is also checking status on his med refill. Please advise.

## 2018-01-23 NOTE — Telephone Encounter (Signed)
Name of Medication: Hydrocodone Name of Pharmacy: [facility], [place] Last Fill or Written Date and Quantity:  45 tablet 0 12/21/2017  Last Office Visit and Type: 09/22/17 Acute Next Office Visit and Type: None Last Controlled Substance Agreement Date: 03/08/14 Last UDS:08/14/15  Electronic refill request. Tramadol Last office visit:   09/22/17 Acute Last Filled:    240 tablet 0 12/21/2017  Please advise.

## 2018-01-23 NOTE — Telephone Encounter (Signed)
Responded to rx on prev phone note.  Thanks.

## 2018-01-23 NOTE — Telephone Encounter (Signed)
Noted.  Sent.  Schedule f/u here when possible.  Thanks .

## 2018-01-24 NOTE — Telephone Encounter (Signed)
Left detailed message on voicemail. DPR 

## 2018-02-19 ENCOUNTER — Other Ambulatory Visit: Payer: Self-pay | Admitting: Family Medicine

## 2018-02-19 DIAGNOSIS — M549 Dorsalgia, unspecified: Secondary | ICD-10-CM

## 2018-02-20 NOTE — Telephone Encounter (Signed)
Name of Medication: Tramadol last refill 01/23/18 #240., Hydrocodone 01/23/18 #45 Name of Pharmacy: [facility] [place] Last Fill or Written Date and Quantity: See above Last Office Visit and Type: 09/22/17 Palpitations Next Office Visit and Type: none scheduled Last Controlled Substance Agreement Date: 01/17/15 Last UDS:03/18/17

## 2018-02-21 MED ORDER — HYDROCODONE-ACETAMINOPHEN 10-325 MG PO TABS: ORAL_TABLET | ORAL | 0 refills | Status: DC

## 2018-02-21 MED ORDER — TRAMADOL HCL 50 MG PO TABS: 100.0000 mg | ORAL_TABLET | Freq: Four times a day (QID) | ORAL | 0 refills | Status: DC

## 2018-02-21 NOTE — Telephone Encounter (Signed)
Sent. Needs f/u scheduled.  He'll need f/u re: future refills.

## 2018-02-21 NOTE — Telephone Encounter (Signed)
Please call and schedule appointment as instructed. 

## 2018-02-21 NOTE — Telephone Encounter (Signed)
Unable to leave a message on patient's voicemail, called his wife (ok per DPR) and left a message asking patient to call us back.

## 2018-02-22 NOTE — Telephone Encounter (Signed)
Left message for patient to call back  

## 2018-02-24 NOTE — Telephone Encounter (Signed)
Letter mailed to the patient

## 2018-02-26 NOTE — Telephone Encounter (Signed)
Thanks

## 2018-03-17 ENCOUNTER — Ambulatory Visit (INDEPENDENT_AMBULATORY_CARE_PROVIDER_SITE_OTHER): Payer: PRIVATE HEALTH INSURANCE | Admitting: Family Medicine

## 2018-03-17 ENCOUNTER — Encounter: Payer: Self-pay | Admitting: Family Medicine

## 2018-03-17 VITALS — BP 110/70 | HR 96 | Temp 97.9°F | Ht 74.0 in | Wt 204.5 lb

## 2018-03-17 DIAGNOSIS — M549 Dorsalgia, unspecified: Secondary | ICD-10-CM

## 2018-03-17 DIAGNOSIS — M25529 Pain in unspecified elbow: Secondary | ICD-10-CM

## 2018-03-17 DIAGNOSIS — R002 Palpitations: Secondary | ICD-10-CM

## 2018-03-17 DIAGNOSIS — G8929 Other chronic pain: Secondary | ICD-10-CM | POA: Diagnosis not present

## 2018-03-17 MED ORDER — HYDROCODONE-ACETAMINOPHEN 10-325 MG PO TABS: ORAL_TABLET | ORAL | 0 refills | Status: DC

## 2018-03-17 MED ORDER — TIZANIDINE HCL 4 MG PO TABS: ORAL_TABLET | ORAL | 0 refills | Status: DC

## 2018-03-17 MED ORDER — TRAMADOL HCL 50 MG PO TABS: 100.0000 mg | ORAL_TABLET | Freq: Four times a day (QID) | ORAL | 0 refills | Status: DC

## 2018-03-17 NOTE — Patient Instructions (Signed)
Get a tennis elbow strap.   Ice as needed.   Don't change your meds for now but get back on tizanidine.    Get a flu shot when possible.  Take care.  Glad to see you.

## 2018-03-17 NOTE — Progress Notes (Signed)
He is still going to follow up here.  D/w pt. I am glad to see the patient here in the clinic.  He moved and is living in [place].  If he needs to establish with a doctor closer to home that I support that.  Otherwise I will still absolutely be glad to see him.  Previous stress echo report d/w pt.   Good exercise tolerance. No chest pain during the exercise. No evidence of ischemia base on ecg and echo criteria. Overall it is NEGATIVE stress test for exercise induced ischemia.  No palpitations, no beta blocker use.    D/w pt about death of his father. He is trying to adjust to the change in his life.  His wife and kids are doing well.    =================================  Right elbow pain.  He has been playing a lot of pool recently.  No trauma otherwise.  Some pain with range of motion near the lateral epicondyle.  =================================  Indication for chronic opioid: chronic back pain Medication and dose: hydrocodone 10/325, 1.5 tabs a day # pills per month: 45 Last UDS date: 03/18/17 Pain contract signed (Y/N): yes Date narcotic database last reviewed (include red flags):  03/19/18  Pain inventory (1-10) Average pain: 5-7/10 Pain now: 7/10 My pain is sharp with walking, stabbing occ, constant o/w . Pain is worse with activity.  Relief from meds: yes  In the last 24 hours, how much has pain interfered with the following (1-10 greatest interference)? General activity- some limitation, he can't golf now.   Relationships with others- still active with his kids Enjoyment of life- see above re: situation with his father.   What time of the day is the pain the worst- with activity Sleep is described by patient as better with unisom, "okay."   Mobility/function Assistance device: no How many minutes can you walk: as needed, with pain.  Able to climb steps:yes Driving:yes Disabled:yes  Bowel or bladder symptoms:no Mood:no  He is off tizanidine in the meantime.   Getting back on that may help, d/w pt.    Physicians involved in care: cardiology.  Any changes since last visit?no, see above.    He is considering nerve ablation.    He'll get a flu shot at the pharmacy.    Meds, vitals, and allergies reviewed.   ROS: Per HPI unless specifically indicated in ROS section   GEN: nad, alert and oriented HEENT: mucous membranes moist NECK: supple w/o LA CV: rrr PULM: ctab, no inc wob ABD: soft, +bs EXT: no edema SKIN: no acute rash SLR neg B. Pain in lower back but not ttp.   Normal range of motion on the right elbow but he has pain at the lateral epicondyles with resisted supination and pronation that improves with compression of the proximal forearm, typical of tennis elbow.

## 2018-03-19 DIAGNOSIS — M25529 Pain in unspecified elbow: Secondary | ICD-10-CM | POA: Insufficient documentation

## 2018-03-19 NOTE — Assessment & Plan Note (Signed)
Okay for outpatient follow-up.  Prescriptions done at office visit today.  He had accidentally stopped taking his tizanidine.  Reasonable to restart to see how much that helps in the meantime.  He is still stretching and trying to be as active as possible.  Continue current medications as is for now otherwise.  He is considering pain clinic follow-up if his symptoms get worse in the future.  I think this is reasonable, for him to consider.  He has been putting up with his symptoms for years now and has been responsible with his medication/stretching/etc.

## 2018-03-19 NOTE — Assessment & Plan Note (Signed)
No symptoms in the meantime.  He is trying to adjust to the death of his father and also moving down to [place].  He has been able to go out to the golf course with his kids and that has been therapeutic and enjoyable.  He is not having palpitations in the meantime.  He had a stress echo done.  He has not had to use a beta-blocker.  He will update me as needed.

## 2018-03-19 NOTE — Assessment & Plan Note (Signed)
Likely tennis elbow.  Discussed with patient about options.  He can use a tennis elbow strap.  He can ice as needed.  Update me as needed.

## 2018-04-05 HISTORY — PX: HERNIA REPAIR: SHX51

## 2018-04-14 ENCOUNTER — Other Ambulatory Visit: Payer: Self-pay | Admitting: Family Medicine

## 2018-04-14 DIAGNOSIS — M549 Dorsalgia, unspecified: Secondary | ICD-10-CM

## 2018-04-14 NOTE — Telephone Encounter (Signed)
Last Rxs given on 12/13

## 2018-04-16 MED ORDER — HYDROCODONE-ACETAMINOPHEN 10-325 MG PO TABS: ORAL_TABLET | ORAL | 0 refills | Status: DC

## 2018-04-16 MED ORDER — TRAMADOL HCL 50 MG PO TABS: 100.0000 mg | ORAL_TABLET | Freq: Four times a day (QID) | ORAL | 0 refills | Status: DC

## 2018-04-16 MED ORDER — TIZANIDINE HCL 4 MG PO TABS: ORAL_TABLET | ORAL | 0 refills | Status: DC

## 2018-04-16 NOTE — Telephone Encounter (Signed)
Sent. Thanks.   

## 2018-04-17 ENCOUNTER — Encounter: Payer: Self-pay | Admitting: Family Medicine

## 2018-04-20 ENCOUNTER — Other Ambulatory Visit: Payer: Self-pay | Admitting: Family Medicine

## 2018-04-20 DIAGNOSIS — M549 Dorsalgia, unspecified: Secondary | ICD-10-CM

## 2018-04-20 MED ORDER — HYDROCODONE-ACETAMINOPHEN 10-325 MG PO TABS: ORAL_TABLET | ORAL | Status: DC

## 2018-05-01 ENCOUNTER — Encounter: Payer: Self-pay | Admitting: Family Medicine

## 2018-05-01 ENCOUNTER — Other Ambulatory Visit: Payer: Self-pay | Admitting: Family Medicine

## 2018-05-01 DIAGNOSIS — M549 Dorsalgia, unspecified: Secondary | ICD-10-CM

## 2018-05-02 ENCOUNTER — Encounter: Payer: Self-pay | Admitting: Family Medicine

## 2018-05-02 NOTE — Telephone Encounter (Signed)
Electronic refill request. Hydrocodone-acetaminophen Last office visit:   03/17/18 Chronic pain med mgmt Last Filled:   #45 on 04/16/2018 See note from patient on MyChart message.  Please advise.

## 2018-05-03 ENCOUNTER — Telehealth: Payer: Self-pay | Admitting: *Deleted

## 2018-05-03 MED ORDER — HYDROCODONE-ACETAMINOPHEN 10-325 MG PO TABS: ORAL_TABLET | ORAL | 0 refills | Status: DC

## 2018-05-03 NOTE — Telephone Encounter (Signed)
Robynn Pane from pharmacy called wanting to bring the following information to Dr. Lianne Bushy attention. Robynn Pane stated that patient got on 04/17/18 Lortab/hydrocodone #45 and also Tramadol 50 mg #240 filled. Robynn Pane stated that this patient is new to them and  concerned that he is doubling up on his Hydrocodone.  Robynn Pane stated that she has checked the registry and looks like patient has been doing this for quite a while.  Robynn Pane requested a call back about this.

## 2018-05-03 NOTE — Telephone Encounter (Signed)
Pharmacy called.  We talked about his clinical situation and his previous appropriate use of hydrocodone and tramadol with effect.  We talked about the rationale for his recent increase in dose and his tolerance thereof, with good effect.  We talked about making sure his prescriptions could get filled at the same time and pharmacy is going to dispense 45 hydrocodone at this point so that this prescription will run out when he is due for his next set of refills.  I thank all involved.  I appreciate pharmacy help.  She thanked me for taking the call.

## 2018-05-03 NOTE — Telephone Encounter (Signed)
Please call them back and thank the pharmacy for the call.  I am aware about his situation.  He did get 45 pills filled earlier this month.  He had been taking 1.5 hydrocodone tabs per day for an extended period of time in addition to tramadol.  His pain has gotten worse and we previously agreed to have him increase his daily dose up to 3 pills/day.  I had every expectation that he would need a refill earlier than usual with a change in total pills per prescription/month.  I appreciate the call. Tthis is what I expected and unfortunately given his back pain/situation this is likely the amount of medication he will need to keep his pain under reasonable control.  Please have them call my cell phone if they have questions or concerns.  I appreciate the help of all involved.

## 2018-05-03 NOTE — Telephone Encounter (Signed)
Noted. Thanks.

## 2018-05-03 NOTE — Telephone Encounter (Signed)
rx sent.  I sent a mychart message to patient.

## 2018-05-03 NOTE — Telephone Encounter (Signed)
[name] notified as instructed by telephone and verbalized understanding. [name] stated that she would appreciate Dr. Lianne Bushy cell number because maybe they can discuss the patient and come up with an alternative medication that may control his pain better. Cell number given to [facility] as instructed.

## 2018-05-14 ENCOUNTER — Encounter: Payer: Self-pay | Admitting: Family Medicine

## 2018-05-14 ENCOUNTER — Other Ambulatory Visit: Payer: Self-pay | Admitting: Family Medicine

## 2018-05-14 DIAGNOSIS — M549 Dorsalgia, unspecified: Secondary | ICD-10-CM

## 2018-05-15 NOTE — Telephone Encounter (Signed)
Electronic refill request. Norco Last office visit:   03/17/18 Pain Mgmt Last Filled:    90 tablet 0 05/03/2018   Electronic refill request. Tramadol Last office visit:   03/17/18 Pain Mgmt Last Filled:    240 tablet 0 04/16/2018   Electronic refill request. Tizanidine Last office visit:   03/17/18 Pain Mgmt Last Filled:    30 tablet 0 04/16/2018   Please advise.

## 2018-05-16 MED ORDER — TIZANIDINE HCL 4 MG PO TABS: ORAL_TABLET | ORAL | 0 refills | Status: DC

## 2018-05-16 MED ORDER — TRAMADOL HCL 50 MG PO TABS: 100.0000 mg | ORAL_TABLET | Freq: Four times a day (QID) | ORAL | 0 refills | Status: DC

## 2018-05-16 MED ORDER — HYDROCODONE-ACETAMINOPHEN 10-325 MG PO TABS: ORAL_TABLET | ORAL | 0 refills | Status: DC

## 2018-05-16 NOTE — Telephone Encounter (Signed)
Sent. Thanks.   

## 2018-06-07 ENCOUNTER — Other Ambulatory Visit: Payer: Self-pay | Admitting: Family Medicine

## 2018-06-07 ENCOUNTER — Encounter: Payer: Self-pay | Admitting: Family Medicine

## 2018-06-07 DIAGNOSIS — M549 Dorsalgia, unspecified: Secondary | ICD-10-CM

## 2018-06-08 ENCOUNTER — Other Ambulatory Visit: Payer: Self-pay | Admitting: *Deleted

## 2018-06-08 NOTE — Telephone Encounter (Signed)
Electronic refill request. Tizanidine Last office visit:   03/17/18 Last Filled:    30 tablet 0 05/16/2018   Electronic refill request. Tramadol Last office visit:   03/17/18 Last Filled:    240 tablet 0 05/16/2018   Name of Medication: Hydrocodone Name of Pharmacy: [facility] Last Fill or Written Date and Quantity:  90 tablet 0 05/16/2018  Last Office Visit and Type:  03/17/18  Pain management Next Office Visit and Type: None Last Controlled Substance Agreement Date: 03/08/2014 Last UDS: 03/18/2017

## 2018-06-09 MED ORDER — TIZANIDINE HCL 4 MG PO TABS: ORAL_TABLET | ORAL | 0 refills | Status: DC

## 2018-06-09 MED ORDER — HYDROCODONE-ACETAMINOPHEN 10-325 MG PO TABS: ORAL_TABLET | ORAL | 0 refills | Status: DC

## 2018-06-09 MED ORDER — TRAMADOL HCL 50 MG PO TABS: 100.0000 mg | ORAL_TABLET | Freq: Four times a day (QID) | ORAL | 0 refills | Status: DC

## 2018-06-09 NOTE — Telephone Encounter (Signed)
Sent. Thanks.   

## 2018-06-27 ENCOUNTER — Encounter: Payer: Self-pay | Admitting: Family Medicine

## 2018-07-03 DIAGNOSIS — M5136 Other intervertebral disc degeneration, lumbar region: Secondary | ICD-10-CM | POA: Insufficient documentation

## 2018-07-03 DIAGNOSIS — G8929 Other chronic pain: Secondary | ICD-10-CM | POA: Insufficient documentation

## 2018-07-03 DIAGNOSIS — Z9889 Other specified postprocedural states: Secondary | ICD-10-CM | POA: Insufficient documentation

## 2018-07-07 ENCOUNTER — Other Ambulatory Visit: Payer: Self-pay

## 2018-07-07 ENCOUNTER — Ambulatory Visit (INDEPENDENT_AMBULATORY_CARE_PROVIDER_SITE_OTHER): Payer: PRIVATE HEALTH INSURANCE | Admitting: Family Medicine

## 2018-07-07 DIAGNOSIS — M549 Dorsalgia, unspecified: Secondary | ICD-10-CM

## 2018-07-07 DIAGNOSIS — G8929 Other chronic pain: Secondary | ICD-10-CM | POA: Diagnosis not present

## 2018-07-07 NOTE — Progress Notes (Signed)
Virtual visit completed through WebEx.  Patient location: home  Provider location: [place] at [facility], office  =======================  Indication for chronic opioid: chronic back pain Medication and dose: hydrocodone 10/325 mg # pills per month: 90 Last UDS date: 03/18/17 Pain contract signed (Y/N): yes Date narcotic database last reviewed (include red flags):  07/07/18 Pain inventory (1-10) Average pain: 08-09-08 Pain now: "I can't golf now" given his level of pain.   My pain is still sharp with walking, stabbing occ, constant o/w . Pain is worse with activity.  Relief from meds: some  In the last 24 hours, how much has pain interfered with the following (1-10 greatest interference)? General activity- some limitation, he can't golf now.   Relationships with others- still trying to be active with his kids Enjoyment of life- see above  What time of the day is the pain the worst- with activity Sleep d/w pt, see below.    Mobility/function Assistance device: no How many minutes can you walk: as needed, with pain.  Able to climb steps:yes Driving:yes Disabled:yes  Bowel or bladder symptoms:no Mood:no  Physicians involved in care: see below.  Any changes since last visit? He had episode of back spasms and is set for f/u MRI, to be done in the near future.    Meds, vitals, and allergies reviewed.   ROS: Per HPI unless specifically indicated in ROS section   GEN: nad, alert and oriented  Assessment and plan. Chronic lower back pain. Status post L4-S1 posterior lumbar interbody fusion with previous nerve root injections, with persistent pain on chronic opiate therapy.  He had seen outside pain clinic in the meantime.  This was completely reasonable.  We talked about options with his pain medication.  He had been on opiates for years with reasonable control of his back pain that would allow him to play golf and spend time being as active as possible with his  kids.  He has been having more pain in the meantime.  This is not a new pain but just worse pain than he had previously had.  Outside clinic as ordered MRI.  This is reasonable given his increase in symptoms.  We talked about cautions with opiate and nonopiate medications such as amitriptyline and gabapentin.  We talked about options with medications at this point.  We agreed not to change his opiates for now but it is reasonable to try amitriptyline at night.  He can do that instead of taking tizanidine.  I will await the MRI.  We will go from there.  He agrees.

## 2018-07-09 ENCOUNTER — Other Ambulatory Visit: Payer: Self-pay | Admitting: Family Medicine

## 2018-07-09 DIAGNOSIS — M549 Dorsalgia, unspecified: Secondary | ICD-10-CM

## 2018-07-09 MED ORDER — TRAMADOL HCL 50 MG PO TABS: 100.0000 mg | ORAL_TABLET | Freq: Four times a day (QID) | ORAL | 0 refills | Status: DC

## 2018-07-09 MED ORDER — AMITRIPTYLINE HCL 10 MG PO TABS: 10.0000 mg | ORAL_TABLET | Freq: Every day | ORAL | 0 refills | Status: DC

## 2018-07-09 MED ORDER — HYDROCODONE-ACETAMINOPHEN 10-325 MG PO TABS: ORAL_TABLET | ORAL | 0 refills | Status: DC

## 2018-07-09 NOTE — Assessment & Plan Note (Addendum)
Indication for chronic opioid: chronic back pain Medication and dose: hydrocodone 10/325 mg # pills per month: 90 Last UDS date: 03/18/17 Pain contract signed (Y/N): yes Date narcotic database last reviewed (include red flags):  07/07/18  Chronic lower back pain. Status post L4-S1 posterior lumbar interbody fusion with previous nerve root injections, with persistent pain on chronic opiate therapy.  He had seen outside pain clinic in the meantime.  This was completely reasonable.  We talked about options with his pain medication.  He had been on opiates for years with reasonable control of his back pain that would allow him to play golf and spend time being as active as possible with his kids.  He has been having more pain in the meantime.  This is not a new pain but just worse pain than he had previously had.  Outside clinic as ordered MRI.  This is reasonable given his increase in symptoms.  We talked about cautions with opiate and nonopiate medications such as amitriptyline and gabapentin.  We talked about options with medications at this point.  We agreed not to change his opiates for now but it is reasonable to try amitriptyline at night.  He can do that instead of taking tizanidine.  I will await the MRI.  We will go from there.  He agrees.

## 2018-07-10 ENCOUNTER — Encounter: Payer: Self-pay | Admitting: Family Medicine

## 2018-07-10 NOTE — Telephone Encounter (Signed)
Med was stopped.  Thanks.

## 2018-07-10 NOTE — Telephone Encounter (Signed)
Last office visit 07/07/2018 for back pain.  Not on current medication list.  Refill?

## 2018-07-17 ENCOUNTER — Encounter: Payer: Self-pay | Admitting: Family Medicine

## 2018-07-21 ENCOUNTER — Telehealth: Payer: Self-pay | Admitting: Family Medicine

## 2018-07-21 NOTE — Telephone Encounter (Signed)
Please print this phone note so it can be scanned into his chart under radiology.  It is an outside MRI report.  Thanks. GSD.     MRI Lumbar Spine without contrast  HISTORY:  Back pain and radiculopathy.  TECHNIQUE: Sagittal T1 and T2 weighted images along with axial T1 and T2 weighted images of the lumbar spine were obtained without the use of intravenous Gadolinium contrast material.    COMPARISON: None  FINDINGS: Postoperative changes from prior posterior fusion at the L4-S1 levels. Degenerative disc disease throughout with disc space narrowing, disc desiccation. No acute fracture or subluxation. Lack of IV contrast limits evaluation for recurrent disc or infectious/inflammatory etiologies.  5 lumbar type vertebral bodies presumed for the purpose of this dictation.  Conus medullaris is at the: L1-L2 intervertebral level  Individual levels of the lumbar spine are as follows:  L1-L2: Facet hypertrophy. No disc bulge or protrusion.  L2-L3: Mild broad-based disc bulge. Mild facet hypertrophy. No central canal or neuroforaminal stenosis.  L3-L4: Broad-based disc bulge. Mild facet hypertrophy. Mild central canal stenosis. Mild bilateral lateral recess stenosis. Mild bilateral transverse neuroforaminal stenosis.  L4-L5: Posterior decompression at this level. No central canal or neuroforaminal stenosis.  L5-S1: Posterior decompression at this level. Facet hypertrophy. No central canal or neuroforaminal stenosis.  IMPRESSION  1. Postoperative changes at the L4-S1 levels. 2. Degenerative changes as described most prominent at L3-L4 with mild central canal stenosis and mild bilateral neuroforaminal stenosis.  Gean Birchwood, MD 07/13/2018 9:28 AM

## 2018-07-21 NOTE — Telephone Encounter (Signed)
Printed. Sent to scan.  ?

## 2018-08-04 ENCOUNTER — Other Ambulatory Visit: Payer: Self-pay | Admitting: Family Medicine

## 2018-08-04 DIAGNOSIS — M549 Dorsalgia, unspecified: Secondary | ICD-10-CM

## 2018-08-05 NOTE — Telephone Encounter (Signed)
Name of Medication: Hydrocodone Name of Pharmacy: [facility] Last Fill or Written Date and Quantity: 07-09-18 #90 Last Office Visit and Type: 07-07-18 Next Office Visit and Type: No Future OV Last Controlled Substance Agreement Date: n/a Last UDS: 03-18-17  Tramadol last filled 07-09-18 #240  Amitriptyline last filled 07-09-18 #30

## 2018-08-06 MED ORDER — AMITRIPTYLINE HCL 10 MG PO TABS: 10.0000 mg | ORAL_TABLET | Freq: Every day | ORAL | 0 refills | Status: DC

## 2018-08-06 MED ORDER — TRAMADOL HCL 50 MG PO TABS: 100.0000 mg | ORAL_TABLET | Freq: Four times a day (QID) | ORAL | 0 refills | Status: DC

## 2018-08-06 MED ORDER — HYDROCODONE-ACETAMINOPHEN 10-325 MG PO TABS: ORAL_TABLET | ORAL | 0 refills | Status: DC

## 2018-08-06 NOTE — Telephone Encounter (Signed)
Please get update on patient re: pain with addition of amitriptyline.   Sent. Thanks.

## 2018-08-07 ENCOUNTER — Encounter: Payer: Self-pay | Admitting: Family Medicine

## 2018-08-07 ENCOUNTER — Encounter: Payer: Self-pay | Admitting: *Deleted

## 2018-08-07 MED ORDER — AMITRIPTYLINE HCL 10 MG PO TABS: 20.0000 mg | ORAL_TABLET | Freq: Every day | ORAL | Status: DC

## 2018-08-07 NOTE — Telephone Encounter (Signed)
Patient states he is sleeping better and his wife says his mood is better.  Patient is taking 2 tablets at bedtime of Amitriptyline.

## 2018-08-07 NOTE — Telephone Encounter (Signed)
If his pain is any better, then I would try cutting back gradually on the hydrocodone (by cutting out 1/2 tab per day, on a "good" day, when possible).  Or he could also try cutting out on tramadol tab per day on a good day.   He could also try increasing the amitriptyline to 3 tabs at night.  If that is tolerated/useful/helps with pain, then I can adjust the next rx.   Update me as needed.   Thanks.

## 2018-08-07 NOTE — Telephone Encounter (Signed)
Message sent through MyChart as patient was just conversing through MyChart and should receive the message quickly. Patient advised.

## 2018-08-09 ENCOUNTER — Encounter: Payer: Self-pay | Admitting: Family Medicine

## 2018-08-23 ENCOUNTER — Encounter: Payer: Self-pay | Admitting: Family Medicine

## 2018-08-29 ENCOUNTER — Encounter: Payer: Self-pay | Admitting: Family Medicine

## 2018-09-01 ENCOUNTER — Encounter: Payer: Self-pay | Admitting: Family Medicine

## 2018-09-01 ENCOUNTER — Other Ambulatory Visit: Payer: Self-pay | Admitting: Family Medicine

## 2018-09-01 DIAGNOSIS — M549 Dorsalgia, unspecified: Secondary | ICD-10-CM

## 2018-09-04 NOTE — Telephone Encounter (Addendum)
Electronic refill request. Amitriptyline Last office visit:   07/07/2018 Last Filled:   #60  On 07/09/2018  Electronic refill request. Tramadol Last office visit: 07/07/2018 Last Filled:    240 tablet 0 08/06/2018   Electronic refill request. Hydrocodone Last office visit:   07/07/2018 Last Filled:    90 tablet 0 08/06/2018   Please advise.

## 2018-09-05 MED ORDER — AMITRIPTYLINE HCL 10 MG PO TABS: 20.0000 mg | ORAL_TABLET | Freq: Every day | ORAL | 0 refills | Status: DC

## 2018-09-05 MED ORDER — TRAMADOL HCL 50 MG PO TABS: 100.0000 mg | ORAL_TABLET | Freq: Four times a day (QID) | ORAL | 0 refills | Status: DC

## 2018-09-05 MED ORDER — HYDROCODONE-ACETAMINOPHEN 10-325 MG PO TABS: ORAL_TABLET | ORAL | 0 refills | Status: DC

## 2018-09-05 NOTE — Telephone Encounter (Signed)
Sent. Thanks.   

## 2018-10-03 ENCOUNTER — Encounter: Payer: Self-pay | Admitting: Family Medicine

## 2018-10-03 ENCOUNTER — Other Ambulatory Visit: Payer: Self-pay | Admitting: Family Medicine

## 2018-10-03 ENCOUNTER — Other Ambulatory Visit: Payer: Self-pay

## 2018-10-03 ENCOUNTER — Ambulatory Visit (INDEPENDENT_AMBULATORY_CARE_PROVIDER_SITE_OTHER): Payer: PRIVATE HEALTH INSURANCE | Admitting: Family Medicine

## 2018-10-03 VITALS — BP 108/70 | HR 72 | Temp 97.1°F | Ht 74.0 in | Wt 209.1 lb

## 2018-10-03 DIAGNOSIS — M549 Dorsalgia, unspecified: Secondary | ICD-10-CM

## 2018-10-03 DIAGNOSIS — Z7189 Other specified counseling: Secondary | ICD-10-CM

## 2018-10-03 DIAGNOSIS — R002 Palpitations: Secondary | ICD-10-CM

## 2018-10-03 DIAGNOSIS — E785 Hyperlipidemia, unspecified: Secondary | ICD-10-CM

## 2018-10-03 DIAGNOSIS — Z0000 Encounter for general adult medical examination without abnormal findings: Secondary | ICD-10-CM | POA: Diagnosis not present

## 2018-10-03 DIAGNOSIS — G8929 Other chronic pain: Secondary | ICD-10-CM

## 2018-10-03 NOTE — Telephone Encounter (Addendum)
Name of Medication: Amitriptyline Name of Pharmacy: [facility] or Written Date and Quantity:  90 tablet 0 09/05/2018  Last Office Visit and Type: 10/03/2018 Next Office Visit and Type: None Last Controlled Substance Agreement Date: 03/08/2014 Last UDS: 10/03/2018  Name of Medication: Hydrocodone Name of Pharmacy: [facility] or Written Date and Quantity:  90 tablet 0 09/05/2018  Last Office Visit and Type: 10/03/2018 Next Office Visit and Type: None Last Controlled Substance Agreement Date: 03/08/2014 Last UDS: 10/03/2018  Name of Medication: Tramadol Name of Pharmacy: [facility] or Written Date and Quantity:  240 tablet 0 09/05/2018  Last Office Visit and Type: 10/03/2018 Next Office Visit and Type: None Last Controlled Substance Agreement Date: 03/08/2014 Last UDS:10/03/2018   Tramadol

## 2018-10-03 NOTE — Progress Notes (Signed)
CPE- See plan.  Routine anticipatory guidance given to patient.  See health maintenance.  The possibility exists that previously documented standard health maintenance information may have been brought forward from a previous encounter into this note.  If needed, that same information has been updated to reflect the current situation based on today's encounter.    Tetanus 2013 Flu encouraged HIV neg 2016, d/w pt about screening.  Colon and prostate CA screening not due.  PNA and shingles not due.  Diet and exercise d/w pt.  D/w pt about carb intake.   Wife designated if patient were incapacitated.  Recheck labs pending, h/o HLD noted.   Chronic pain.   Indication for chronic opioid: chronic back pain Medication and dose: hydrocodone 10/325 mg # pills per month: 90 Last UDS date:03/18/17 Pain contract signed (Y/N): yes Date narcotic database last reviewed (include red flags): 07/07/18  Still tender just lateral to the B lower spine. Prev paresthesia in L leg is resolved.   Prev MRI:  1. Postoperative changes at the L4-S1 levels. 2. Degenerative changes as described most prominent at L3-L4 with mild central canal stenosis and mild bilateral neuroforaminal stenosis.  Taking 20mg  amitriptyline, sleeping well, not drowsy the next day.  Wife noted improved mood.  He is trying to exercise and stretch.  He has been 2 tramadol/1 hydrocodone in the AM, again midday and then again at night. He has tried to taper his pain meds episodically.    S/p L inguinal hernia repair.  He has not felt a bulge in the meantime.  Testicle pain is resolved.   Palpitations.  No use of metoprolol.  No CP or palpitations.   PMH and SH reviewed  Meds, vitals, and allergies reviewed.   ROS: Per HPI.  Unless specifically indicated otherwise in HPI, the patient denies:  General: fever. Eyes: acute vision changes ENT: sore throat Cardiovascular: chest pain Respiratory: SOB GI: vomiting GU:  dysuria Musculoskeletal: acute back pain Derm: acute rash Neuro: acute motor dysfunction Psych: worsening mood Endocrine: polydipsia Heme: bleeding Allergy: hayfever  GEN: nad, alert and oriented HEENT: ncat NECK: supple w/o LA CV: rrr. PULM: ctab, no inc wob ABD: soft, +bs EXT: no edema SKIN: no acute rash Still tender just lateral to the B lower spine. Lumbar scar noted. Normal healing on abdominal scars from hernia repair.

## 2018-10-03 NOTE — Patient Instructions (Signed)
Go to the lab on the way out.  We'll contact you with your lab report. Try increasing to 2.5 and then 3 amitriptyline at night if tolerated.  See if you can decrease your tramadol vs hydrocodone.  Take care.  Glad to see you.  I would get a flu shot each fall.   Update me as needed.

## 2018-10-04 ENCOUNTER — Other Ambulatory Visit: Payer: Self-pay | Admitting: Family Medicine

## 2018-10-04 DIAGNOSIS — M549 Dorsalgia, unspecified: Secondary | ICD-10-CM

## 2018-10-04 LAB — LIPID PANEL
Cholesterol: 285 mg/dL — ABNORMAL HIGH (ref 0–200)
HDL: 56.2 mg/dL (ref >39.00)
LDL Cholesterol: 205 mg/dL — ABNORMAL HIGH (ref 0–99)
NonHDL: 228.35
Total CHOL/HDL Ratio: 5
Triglycerides: 119 mg/dL (ref 0.0–149.0)
VLDL: 23.8 mg/dL (ref 0.0–40.0)

## 2018-10-04 LAB — COMPREHENSIVE METABOLIC PANEL
ALT: 12 U/L (ref 0–53)
AST: 15 U/L (ref 0–37)
Albumin: 4.8 g/dL (ref 3.5–5.2)
Alkaline Phosphatase: 69 U/L (ref 39–117)
BUN: 11 mg/dL (ref 6–23)
CO2: 30 mEq/L (ref 19–32)
Calcium: 9.4 mg/dL (ref 8.4–10.5)
Chloride: 101 mEq/L (ref 96–112)
Creatinine, Ser: 1.01 mg/dL (ref 0.40–1.50)
GFR: 80.63 mL/min (ref >60.00)
Glucose, Bld: 94 mg/dL (ref 70–99)
Potassium: 4.6 mEq/L (ref 3.5–5.1)
Sodium: 139 mEq/L (ref 135–145)
Total Bilirubin: 0.5 mg/dL (ref 0.2–1.2)
Total Protein: 7.3 g/dL (ref 6.0–8.3)

## 2018-10-05 LAB — PAIN MGMT, PROFILE 8 W/CONF, U
6 Acetylmorphine: NEGATIVE ng/mL
Alcohol Metabolites: NEGATIVE ng/mL (ref <500)
Amphetamines: NEGATIVE ng/mL
Benzodiazepines: NEGATIVE ng/mL
Buprenorphine, Urine: NEGATIVE ng/mL
Cocaine Metabolite: NEGATIVE ng/mL
Codeine: NEGATIVE ng/mL
Creatinine: 19.6 mg/dL
Hydrocodone: 81 ng/mL
Hydromorphone: NEGATIVE ng/mL
MDMA: NEGATIVE ng/mL
Marijuana Metabolite: NEGATIVE ng/mL
Morphine: NEGATIVE ng/mL
Norhydrocodone: 222 ng/mL
Opiates: POSITIVE ng/mL
Oxidant: NEGATIVE ug/mL
Oxycodone: NEGATIVE ng/mL
Specific Gravity: 1.003 (ref >=1.0)
pH: 6.6 (ref 4.5–9.0)

## 2018-10-05 MED ORDER — HYDROCODONE-ACETAMINOPHEN 10-325 MG PO TABS: ORAL_TABLET | ORAL | 0 refills | Status: DC

## 2018-10-05 MED ORDER — TRAMADOL HCL 50 MG PO TABS: 100.0000 mg | ORAL_TABLET | Freq: Four times a day (QID) | ORAL | 0 refills | Status: DC

## 2018-10-05 MED ORDER — AMITRIPTYLINE HCL 10 MG PO TABS: 20.0000 mg | ORAL_TABLET | Freq: Every day | ORAL | 0 refills | Status: DC

## 2018-10-05 NOTE — Assessment & Plan Note (Signed)
No use of metoprolol.  No CP or palpitations.

## 2018-10-05 NOTE — Telephone Encounter (Signed)
Sent. Thanks.   

## 2018-10-05 NOTE — Assessment & Plan Note (Signed)
See notes on labs. 

## 2018-10-05 NOTE — Assessment & Plan Note (Signed)
Wife designated if patient were incapacitated.  

## 2018-10-05 NOTE — Assessment & Plan Note (Signed)
Tetanus 2013 Flu encouraged HIV neg 2016, d/w pt about screening.  Colon and prostate CA screening not due.  PNA and shingles not due.  Diet and exercise d/w pt.  D/w pt about carb intake.   Wife designated if patient were incapacitated.  Recheck labs pending, h/o HLD noted.

## 2018-10-05 NOTE — Assessment & Plan Note (Signed)
Chronic pain.   Indication for chronic opioid: chronic back pain Medication and dose: hydrocodone 10/325 mg # pills per month: 90 Last UDS date:03/18/17 Pain contract signed (Y/N): yes Date narcotic database last reviewed (include red flags): 07/07/18  Still tender just lateral to the B lower spine. Prev paresthesia in L leg is resolved.   Prev MRI:  1. Postoperative changes at the L4-S1 levels. 2. Degenerative changes as described most prominent at L3-L4 with mild central canal stenosis and mild bilateral neuroforaminal stenosis.  Taking 20mg  amitriptyline, sleeping well, not drowsy the next day.  Wife noted improved mood.  He is trying to exercise and stretch.  He has been 2 tramadol/1 hydrocodone in the AM, again midday and then again at night. He has tried to taper his pain meds episodically.    Discussed trying to increase amitriptyline to 25 then 30 mg at night if tolerated and he will see if he can taper his pain medication at that point.  We can consider a trial of gabapentin in the future.  Okay for outpatient follow-up.  Continue stretching.

## 2018-10-09 NOTE — Telephone Encounter (Signed)
Looks like Dr. Damita Dunnings wanted patient to start Lipitor. But dose is not mentioned. Thank you

## 2018-10-12 MED ORDER — ATORVASTATIN CALCIUM 40 MG PO TABS: 40.0000 mg | ORAL_TABLET | Freq: Every day | ORAL | 11 refills | Status: DC

## 2018-10-29 ENCOUNTER — Other Ambulatory Visit: Payer: Self-pay | Admitting: Family Medicine

## 2018-10-29 DIAGNOSIS — M549 Dorsalgia, unspecified: Secondary | ICD-10-CM

## 2018-10-30 NOTE — Telephone Encounter (Signed)
Amitriptyline, Tramadol, Hydrocodone were all refilled last on 10/05/2018. LOV was 10/03/2018 for CPE. No future appointments scheduled.  Last Controlled Substance Agreement Date: 03/08/2014 Last UDS:10/03/2018

## 2018-10-31 MED ORDER — HYDROCODONE-ACETAMINOPHEN 10-325 MG PO TABS: ORAL_TABLET | ORAL | 0 refills | Status: DC

## 2018-10-31 MED ORDER — AMITRIPTYLINE HCL 10 MG PO TABS: 20.0000 mg | ORAL_TABLET | Freq: Every day | ORAL | 0 refills | Status: DC

## 2018-10-31 MED ORDER — TRAMADOL HCL 50 MG PO TABS: 100.0000 mg | ORAL_TABLET | Freq: Four times a day (QID) | ORAL | 0 refills | Status: DC

## 2018-10-31 NOTE — Telephone Encounter (Signed)
Sent. Thanks.   

## 2018-11-11 ENCOUNTER — Encounter: Payer: Self-pay | Admitting: Family Medicine

## 2018-11-27 ENCOUNTER — Other Ambulatory Visit: Payer: Self-pay | Admitting: Family Medicine

## 2018-11-27 DIAGNOSIS — M549 Dorsalgia, unspecified: Secondary | ICD-10-CM

## 2018-11-28 NOTE — Telephone Encounter (Signed)
Name of Medication: Hydrocodone-acetaminophen 10-325 Name of Pharmacy: [facility] or Written Date and Quantity:  90 tablet 0 10/31/2018  Last Office Visit and Type: 10/03/2018 CPE Next Office Visit and Type: None Last Controlled Substance Agreement Date: 01/17/15 Last UDS:10/03/2018  Name of Medication: Tramadol Name of Pharmacy: [facility] or Written Date and Quantity:  240 tablet 0 10/31/2018  Last Office Visit and Type: 10/03/2018 CPE Next Office Visit and Type: None Last Controlled Substance Agreement Date: 01/17/15 Last UDS:10/03/2018  Electronic refill request. Amitriptyline Last office visit:   10/03/2018 Last Filled:    90 tablet 0 10/31/2018  Please advise.

## 2018-11-29 ENCOUNTER — Encounter: Payer: Self-pay | Admitting: Family Medicine

## 2018-11-29 MED ORDER — HYDROCODONE-ACETAMINOPHEN 10-325 MG PO TABS: ORAL_TABLET | ORAL | 0 refills | Status: DC

## 2018-11-29 MED ORDER — AMITRIPTYLINE HCL 10 MG PO TABS: 20.0000 mg | ORAL_TABLET | Freq: Every day | ORAL | 0 refills | Status: DC

## 2018-11-29 MED ORDER — TRAMADOL HCL 50 MG PO TABS: 100.0000 mg | ORAL_TABLET | Freq: Four times a day (QID) | ORAL | 0 refills | Status: DC

## 2018-11-29 NOTE — Telephone Encounter (Signed)
Sent. Thanks.  Reasonable for pain follow up in about 1 month.

## 2018-11-30 ENCOUNTER — Other Ambulatory Visit: Payer: Self-pay | Admitting: Family Medicine

## 2018-11-30 DIAGNOSIS — E785 Hyperlipidemia, unspecified: Secondary | ICD-10-CM

## 2018-11-30 NOTE — Telephone Encounter (Signed)
Patient advised.

## 2018-12-26 ENCOUNTER — Ambulatory Visit: Payer: PRIVATE HEALTH INSURANCE | Admitting: Family Medicine

## 2018-12-28 ENCOUNTER — Encounter: Payer: Self-pay | Admitting: Family Medicine

## 2018-12-28 ENCOUNTER — Other Ambulatory Visit: Payer: Self-pay

## 2018-12-28 ENCOUNTER — Other Ambulatory Visit: Payer: Self-pay | Admitting: *Deleted

## 2018-12-28 ENCOUNTER — Ambulatory Visit: Payer: PRIVATE HEALTH INSURANCE | Admitting: Family Medicine

## 2018-12-28 VITALS — BP 110/72 | HR 98 | Temp 97.2°F | Ht 74.0 in | Wt 209.4 lb

## 2018-12-28 DIAGNOSIS — Z23 Encounter for immunization: Secondary | ICD-10-CM | POA: Diagnosis not present

## 2018-12-28 DIAGNOSIS — E782 Mixed hyperlipidemia: Secondary | ICD-10-CM | POA: Diagnosis not present

## 2018-12-28 DIAGNOSIS — E785 Hyperlipidemia, unspecified: Secondary | ICD-10-CM

## 2018-12-28 DIAGNOSIS — M549 Dorsalgia, unspecified: Secondary | ICD-10-CM

## 2018-12-28 DIAGNOSIS — G8929 Other chronic pain: Secondary | ICD-10-CM

## 2018-12-28 LAB — HEPATIC FUNCTION PANEL
ALT: 23 U/L (ref 0–53)
AST: 17 U/L (ref 0–37)
Albumin: 4.8 g/dL (ref 3.5–5.2)
Alkaline Phosphatase: 77 U/L (ref 39–117)
Bilirubin, Direct: 0.1 mg/dL (ref 0.0–0.3)
Total Bilirubin: 0.4 mg/dL (ref 0.2–1.2)
Total Protein: 7.4 g/dL (ref 6.0–8.3)

## 2018-12-28 LAB — LIPID PANEL
Cholesterol: 183 mg/dL (ref 0–200)
HDL: 56.9 mg/dL (ref >39.00)
LDL Cholesterol: 86 mg/dL (ref 0–99)
NonHDL: 125.62
Total CHOL/HDL Ratio: 3
Triglycerides: 200 mg/dL — ABNORMAL HIGH (ref 0.0–149.0)
VLDL: 40 mg/dL (ref 0.0–40.0)

## 2018-12-28 MED ORDER — TRAMADOL HCL 50 MG PO TABS: 100.0000 mg | ORAL_TABLET | Freq: Four times a day (QID) | ORAL | 0 refills | Status: DC

## 2018-12-28 MED ORDER — HYDROCODONE-ACETAMINOPHEN 10-325 MG PO TABS: ORAL_TABLET | ORAL | 0 refills | Status: DC

## 2018-12-28 NOTE — Patient Instructions (Signed)
Don't change your meds for now.  Go to the lab on the way out.  We'll contact you with your lab report. Take care.  Glad to see you.  

## 2018-12-28 NOTE — Progress Notes (Signed)
Indication for chronic opioid: chronic back pain.   Medication and dose: see med list. # pills per month:  See med list.  Last UDS date: 10/03/2018.  Pain contract signed (Y/N): yes Date narcotic database last reviewed (include red flags): 12/28/18   Adding on amitriptyline helped with pain.  We talked about pain clinic referral but he wanted to continue as is, as long as his pain is stable or not worse.  He is not worse in the meantime.  Pain inventory (1-10) Average pain: ~4/10 with meds.  Pain now: 4/10 My pain is constant Pain is worse off med.  Relief from meds: yes  In the last 24 hours, how much has pain interfered with the following (1-10 greatest interference)? General activity- able to caddy for his daughter now Relationships with others- stable.  Enjoyment of life- stable, see above.  What time of the day is the pain the worst- AM, before meds.  Sleep is described by patient as "good."   Mobility/function Assistance device: no How many minutes can you walk: as needed.  Able to climb steps:yes Driving:yes Disabled:no  Bowel or bladder symptoms:no Mood: good.   Physicians involved in care: no  Elevated Cholesterol: Using medications without problems: yes Muscle aches: Likely not from statin.  Diet compliance:yes Exercise:yes He is working on diet in the meantime.  He cut back on red meat. Due for f/u labs.    No metoprolol use or needed.    Meds, vitals, and allergies reviewed.  ROS: Per HPI unless specifically indicated in ROS section   GEN: nad, alert and oriented HEENT: ncat NECK: supple w/o LA CV: rrr PULM: ctab, no inc wob ABD: soft, +bs EXT: no edema SKIN: no acute rash

## 2018-12-30 ENCOUNTER — Other Ambulatory Visit: Payer: Self-pay | Admitting: Family Medicine

## 2018-12-31 NOTE — Assessment & Plan Note (Signed)
I would expect improvement in his labs.  See notes on labs.  No obvious adverse effect on medication.

## 2018-12-31 NOTE — Assessment & Plan Note (Addendum)
No change in meds at this point.  We talked about options.  He wants to avoid surgery if at all possible.  He did not yet want to go through with pain clinic referral if his pain is still reasonably well controlled and he is able to be functional in his daily life.  This is the case.  He is able to caddy for his [place] the golf [place] and is happy about that.  He is not having obvious adverse effects on any of his medications.  Reasonable to continue as is for now.  He is working diligently on diet and stretching and exercise.  Update me as needed.  Recheck periodically.  He agrees.

## 2019-01-01 ENCOUNTER — Encounter: Payer: Self-pay | Admitting: Family Medicine

## 2019-01-01 NOTE — Telephone Encounter (Signed)
Electronic refill request. Amtriptyline Last office visit:   12/28/2018 Last Filled:    90 tablet 0 11/29/2018  Please advise.

## 2019-01-02 MED ORDER — AMITRIPTYLINE HCL 10 MG PO TABS: 20.0000 mg | ORAL_TABLET | Freq: Every day | ORAL | 3 refills | Status: DC

## 2019-01-02 NOTE — Telephone Encounter (Signed)
Sent. Thanks.   

## 2019-01-04 ENCOUNTER — Encounter: Payer: Self-pay | Admitting: *Deleted

## 2019-01-24 ENCOUNTER — Other Ambulatory Visit: Payer: Self-pay | Admitting: Family Medicine

## 2019-01-24 DIAGNOSIS — M549 Dorsalgia, unspecified: Secondary | ICD-10-CM

## 2019-01-25 NOTE — Telephone Encounter (Signed)
Name of Medication: Hydrocodone-acetaminophen 10-325 Name of Pharmacy: [facility] or Written Date and Quantity:  90 tablet 0 12/28/2018  Last Office Visit and Type: 12/28/2018 Follow up Next Office Visit and Type: 04/02/2019 Last Controlled Substance Agreement Date: 01/17/15 Last UDS:12/28/2018  Name of Medication: Tramadol Name of Pharmacy: [facility] or Written Date and Quantity:  240 tablet 0 12/28/2018  Last Office Visit and Type: 12/28/2018 Next Office Visit and Type: 04/02/2019 Last Controlled Substance Agreement Date: 01/17/15 Last UDS:12/28/2018

## 2019-01-26 MED ORDER — HYDROCODONE-ACETAMINOPHEN 10-325 MG PO TABS: ORAL_TABLET | ORAL | 0 refills | Status: DC

## 2019-01-26 MED ORDER — TRAMADOL HCL 50 MG PO TABS: 100.0000 mg | ORAL_TABLET | Freq: Four times a day (QID) | ORAL | 0 refills | Status: DC

## 2019-01-26 NOTE — Telephone Encounter (Signed)
Sent. Thanks.   

## 2019-02-19 ENCOUNTER — Other Ambulatory Visit: Payer: Self-pay | Admitting: Family Medicine

## 2019-02-19 DIAGNOSIS — M549 Dorsalgia, unspecified: Secondary | ICD-10-CM

## 2019-02-20 NOTE — Telephone Encounter (Signed)
Name of Medication: Hydrocodone Name of Pharmacy: [facility] or Written Date and Quantity:  90 tablet 0 01/26/2019  Last Office Visit and Type: 12/28/2018 LBP Next Office Visit and Type: 04/02/2019 Pain Mgmt Last Controlled Substance Agreement Date: 01/17/15 Last UDS: 10/03/2018  Electronic refill request. Tramadol Last office visit:   12/28/2018 Last Filled:    240 tablet 0 01/26/2019  Please advise.

## 2019-02-21 MED ORDER — TRAMADOL HCL 50 MG PO TABS: 100.0000 mg | ORAL_TABLET | Freq: Four times a day (QID) | ORAL | 0 refills | Status: DC

## 2019-02-21 MED ORDER — HYDROCODONE-ACETAMINOPHEN 10-325 MG PO TABS: ORAL_TABLET | ORAL | 0 refills | Status: DC

## 2019-02-21 NOTE — Telephone Encounter (Signed)
Sent. Thanks.   

## 2019-03-07 ENCOUNTER — Encounter: Payer: Self-pay | Admitting: Family Medicine

## 2019-03-22 ENCOUNTER — Other Ambulatory Visit: Payer: Self-pay | Admitting: Family Medicine

## 2019-03-22 DIAGNOSIS — M549 Dorsalgia, unspecified: Secondary | ICD-10-CM

## 2019-03-23 NOTE — Telephone Encounter (Signed)
Electronic refill request. Tramadol Last office visit:   12/28/2018 Last Filled:    240 tablet 0 02/21/2019  Please advise.  Name of Medication: Hydrocodone Name of Pharmacy: [facility] or Written Date and Quantity:  90 tablet 0 02/21/2019  Last Office Visit and Type: 12/28/2018 Pain mgmt. Next Office Visit and Type: 04/02/2019 Pain mgmt. Last Controlled Substance Agreement Date: 03/19/2015 Last UDS:  10/03/2018

## 2019-03-25 MED ORDER — HYDROCODONE-ACETAMINOPHEN 10-325 MG PO TABS: ORAL_TABLET | ORAL | 0 refills | Status: DC

## 2019-03-25 MED ORDER — TRAMADOL HCL 50 MG PO TABS: 100.0000 mg | ORAL_TABLET | Freq: Four times a day (QID) | ORAL | 0 refills | Status: DC

## 2019-03-25 NOTE — Telephone Encounter (Signed)
Sent. Thanks.   

## 2019-03-26 ENCOUNTER — Encounter: Payer: Self-pay | Admitting: Family Medicine

## 2019-04-02 ENCOUNTER — Other Ambulatory Visit: Payer: Self-pay

## 2019-04-02 ENCOUNTER — Ambulatory Visit (INDEPENDENT_AMBULATORY_CARE_PROVIDER_SITE_OTHER): Payer: PRIVATE HEALTH INSURANCE | Admitting: Family Medicine

## 2019-04-02 DIAGNOSIS — E782 Mixed hyperlipidemia: Secondary | ICD-10-CM | POA: Diagnosis not present

## 2019-04-02 DIAGNOSIS — G8929 Other chronic pain: Secondary | ICD-10-CM

## 2019-04-02 NOTE — Progress Notes (Signed)
Virtual visit completed through WebEx or similar program Patient location: home  Provider location: [facility] at [facility], office   Pandemic considerations d/w pt.   Limitations and rationale for visit method d/w patient.  Patient agreed to proceed.   CC: pain med f/u.   HPI:  Lipids d/w pt.    Indication for chronic opioid: chronic back pain.   Medication and dose: see med list. # pills per month:  See med list.  Last UDS date: 10/03/2018.  Pain contract signed (Y/N): yes Date narcotic database last reviewed (include red flags): 03/25/2019  Adding on amitriptyline helped with pain.  We talked about pain clinic referral but he wanted to continue as is for now, as long as his pain is stable or not worse.  He is not worse in the meantime.  We talked about pain clinic eval and potential recheck in ~spring 2021.    He is exercising and stretching.  He hasn't had a bad episode of spasms in months, so that is a good change.  He is using stool softeners prn and staying well hydrated.    He can caddy for his daughters with golf and he is happy about that.    Meds and allergies reviewed.   ROS: Per HPI unless specifically indicated in ROS section   NAD Speech wnl  A/P:  Hyperlipidemia.  We can check labs prior to next visit in about 3 months.  We can send a lab order to the patient.  Chronic back pain.  Previous imaging noted.  IMPRESSION  1. Postoperative changes at the L4-S1 levels. 2. Degenerative changes as described most prominent at L3-L4 with mild central canal stenosis and mild bilateral neuroforaminal stenosis.  No change in meds at this point.  No adverse effect on medication.  Not sedated.  He is using stool softeners as needed.  It may be reasonable to have him see the pain clinic again in the spring just to see if they have any other options or advice in the meantime.  This is not an urgent issue.  Overall he is doing well and his functional status is good with his  current medications.  He will continue exercising and stretching.

## 2019-04-05 ENCOUNTER — Encounter: Payer: Self-pay | Admitting: Family Medicine

## 2019-04-05 NOTE — Assessment & Plan Note (Addendum)
We can check labs prior to next visit in about 3 months.  We can send a lab order to the patient.

## 2019-04-05 NOTE — Assessment & Plan Note (Signed)
Chronic back pain.  Previous imaging noted.  IMPRESSION  1. Postoperative changes at the L4-S1 levels. 2. Degenerative changes as described most prominent at L3-L4 with mild central canal stenosis and mild bilateral neuroforaminal stenosis.  No change in meds at this point.  No adverse effect on medication.  Not sedated.  He is using stool softeners as needed.  It may be reasonable to have him see the pain clinic again in the spring just to see if they have any other options or advice in the meantime.  This is not an urgent issue.  Overall he is doing well and his functional status is good with his current medications.  He will continue exercising and stretching.

## 2019-04-06 HISTORY — PX: ESOPHAGOGASTRODUODENOSCOPY: SHX1529

## 2019-04-08 ENCOUNTER — Encounter: Payer: Self-pay | Admitting: Family Medicine

## 2019-04-08 NOTE — Progress Notes (Signed)
Please mail copy of letter to patient after I sign it.  Thanks.

## 2019-04-10 ENCOUNTER — Encounter: Payer: Self-pay | Admitting: Family Medicine

## 2019-04-11 NOTE — Telephone Encounter (Signed)
I think that makes sense.  Thanks.

## 2019-04-11 NOTE — Telephone Encounter (Signed)
Dr Para March,  I replied to patient to give [place] a call to schedule virtual visit for his symptoms.

## 2019-04-23 ENCOUNTER — Other Ambulatory Visit: Payer: Self-pay | Admitting: Family Medicine

## 2019-04-23 DIAGNOSIS — M549 Dorsalgia, unspecified: Secondary | ICD-10-CM

## 2019-04-25 ENCOUNTER — Other Ambulatory Visit: Payer: Self-pay | Admitting: Family Medicine

## 2019-04-25 MED ORDER — HYDROCODONE-ACETAMINOPHEN 10-325 MG PO TABS: ORAL_TABLET | ORAL | 0 refills | Status: DC

## 2019-04-25 MED ORDER — TRAMADOL HCL 50 MG PO TABS: 100.0000 mg | ORAL_TABLET | Freq: Four times a day (QID) | ORAL | 0 refills | Status: DC

## 2019-04-25 NOTE — Telephone Encounter (Signed)
Sent. Thanks.   

## 2019-04-26 NOTE — Telephone Encounter (Signed)
Sent. Thanks.   

## 2019-05-21 ENCOUNTER — Other Ambulatory Visit: Payer: Self-pay | Admitting: Family Medicine

## 2019-05-21 DIAGNOSIS — M549 Dorsalgia, unspecified: Secondary | ICD-10-CM

## 2019-05-21 NOTE — Telephone Encounter (Signed)
Electronic refill request. Tramadol Last office visit:   04/02/2019 Last Filled:    240 tablet 0 04/25/2019   Electronic refill request. Hydrocodone Last office visit:   04/02/2019 Last Filled:    90 tablet 0 04/25/2019   Please advise.

## 2019-05-22 MED ORDER — HYDROCODONE-ACETAMINOPHEN 10-325 MG PO TABS: ORAL_TABLET | ORAL | 0 refills | Status: DC

## 2019-05-22 MED ORDER — TRAMADOL HCL 50 MG PO TABS: 100.0000 mg | ORAL_TABLET | Freq: Four times a day (QID) | ORAL | 0 refills | Status: DC

## 2019-05-22 NOTE — Telephone Encounter (Signed)
Sent. Thanks.   

## 2019-06-13 ENCOUNTER — Other Ambulatory Visit: Payer: Self-pay | Admitting: Family Medicine

## 2019-06-13 DIAGNOSIS — M549 Dorsalgia, unspecified: Secondary | ICD-10-CM

## 2019-06-14 NOTE — Telephone Encounter (Signed)
Name of Medication: Tramadol Name of Pharmacy: [facility] Last Fill or Written Date and Quantity:  240 tablet 0 05/22/2019  Last Office Visit and Type: 04/02/2019  Chronic pain management Next Office Visit and Type: 07/12/2019 Chronic pain management Last Controlled Substance Agreement Date: 01/17/15 Last UDS: 10/03/2018  Name of Medication: Hydrocodone Name of Pharmacy: [facility] Last Fill or Written Date and Quantity:  90 tablet 0 05/22/2019  Last Office Visit and Type: 04/02/2019 Chronic pain management Next Office Visit and Type: 07/12/2019 Chronic Pain Management Last Controlled Substance Agreement Date: 01/17/2015 Last UDS:  10/03/2018

## 2019-06-15 MED ORDER — HYDROCODONE-ACETAMINOPHEN 10-325 MG PO TABS: ORAL_TABLET | ORAL | 0 refills | Status: DC

## 2019-06-15 MED ORDER — TRAMADOL HCL 50 MG PO TABS: 100.0000 mg | ORAL_TABLET | Freq: Four times a day (QID) | ORAL | 0 refills | Status: DC

## 2019-06-15 NOTE — Telephone Encounter (Signed)
Sent. Thanks.   

## 2019-06-28 ENCOUNTER — Ambulatory Visit: Payer: PRIVATE HEALTH INSURANCE | Admitting: Family Medicine

## 2019-07-09 ENCOUNTER — Encounter: Payer: Self-pay | Admitting: Family Medicine

## 2019-07-12 ENCOUNTER — Encounter: Payer: Self-pay | Admitting: Family Medicine

## 2019-07-12 ENCOUNTER — Ambulatory Visit (INDEPENDENT_AMBULATORY_CARE_PROVIDER_SITE_OTHER): Payer: PRIVATE HEALTH INSURANCE | Admitting: Family Medicine

## 2019-07-12 ENCOUNTER — Other Ambulatory Visit: Payer: Self-pay

## 2019-07-12 ENCOUNTER — Other Ambulatory Visit: Payer: Self-pay | Admitting: Family Medicine

## 2019-07-12 VITALS — BP 112/70 | HR 65 | Temp 97.0°F | Ht 74.0 in | Wt 210.6 lb

## 2019-07-12 DIAGNOSIS — G8929 Other chronic pain: Secondary | ICD-10-CM | POA: Diagnosis not present

## 2019-07-12 DIAGNOSIS — R1310 Dysphagia, unspecified: Secondary | ICD-10-CM | POA: Diagnosis not present

## 2019-07-12 DIAGNOSIS — E782 Mixed hyperlipidemia: Secondary | ICD-10-CM

## 2019-07-12 DIAGNOSIS — M549 Dorsalgia, unspecified: Secondary | ICD-10-CM

## 2019-07-12 NOTE — Telephone Encounter (Signed)
Electronic refill request. Hydrocodone Last office visit:   07/12/2019 Last Filled:    90 tablet 0 06/15/2019   Electronic refill request. [name] Last office visit:   07/12/2019 Last Filled:    240 tablet 0 06/15/2019  Please advise.

## 2019-07-12 NOTE — Progress Notes (Signed)
This visit occurred during the SARS-CoV-2 public health emergency.  Safety protocols were in place, including screening questions prior to the visit, additional usage of staff PPE, and extensive cleaning of exam room while observing appropriate contact time as indicated for disinfecting solutions.  Indication for chronic opioid:chronic back pain. Medication and dose:see med list. # pills per month:See med list. Last UDS date:10/03/2018. Pain contract signed (Y/N):yes Date narcotic database last reviewed (include red flags): 07/12/19   Adding on amitriptyline helped with pain. We talked about pain clinic referral but he wanted to continue as is for now, as long ashis pain is stable or not worse. He is not worse in the meantime.    He is stretching and walking and doing all he can to avoid surgery.   Pain inventory (1-10) Average pain:4-5/10 Pain now: 4-5/10 My pain is dull and constant Pain is worse with certain movements, ie lifting with bad leverage Relief from meds: yes  In the last 24 hours, how much has pain interfered with the following (1-10 greatest interference)? General activity- able to compensate Relationships with others- still able to caddy for his daughter.   Enjoyment of life- able to compensate What time of the day is the pain the worst- early AM.   Sleep is described by patient as better with amitriptyline.    Mobility/function Assistance device:no How many minutes can you walk: as needed, can caddy for his daughter.   Able to climb steps: yes Driving: no issue Disabled: no issue  Bowel or bladder symptoms: no Mood: good.    Physicians involved in care: see below Any changes since last visit? See below.    He has some dysphagia with larger pills and d/w pt about GI eval.  He avoid pork due to trouble swallowing, with stoppage in mid esophagus.  No choking.  No blood in stool.  Sx going on for "a while" but worse recently.    He is tolerating  statin.    Meds, vitals, and allergies reviewed.   ROS: Per HPI unless specifically indicated in ROS section   GEN: nad, alert and oriented HEENT: ncat NECK: supple w/o LA CV: rrr PULM: ctab, no inc wob ABD: soft, +bs EXT: no edema SKIN: no acute rash

## 2019-07-12 NOTE — Patient Instructions (Addendum)
We'll call about seeing GI.   Don't change your meds for now.  Take care.  Glad to see you. Update me as needed.

## 2019-07-13 MED ORDER — TRAMADOL HCL 50 MG PO TABS: 100.0000 mg | ORAL_TABLET | Freq: Four times a day (QID) | ORAL | 0 refills | Status: DC

## 2019-07-13 MED ORDER — HYDROCODONE-ACETAMINOPHEN 10-325 MG PO TABS: ORAL_TABLET | ORAL | 0 refills | Status: DC

## 2019-07-13 NOTE — Telephone Encounter (Signed)
Sent. Thanks.   

## 2019-07-15 DIAGNOSIS — R1310 Dysphagia, unspecified: Secondary | ICD-10-CM | POA: Insufficient documentation

## 2019-07-15 NOTE — Assessment & Plan Note (Signed)
Refer to GI.  He agrees. 

## 2019-07-15 NOTE — Assessment & Plan Note (Addendum)
Indication for chronic opioid:chronic back pain. Medication and dose:see med list. # pills per month:See med list. Last UDS date:10/03/2018. Pain contract signed (Y/N):yes Date narcotic database last reviewed (include red flags): 07/12/19   No change in meds at this point.  He is able to function and be active with his kids with his current dose.  No adverse effect on medication.  We can refer to pain clinic for consideration of more invasive treatment in the future should he desire this or if his symptoms are worse.  He agrees.  Still okay for outpatient follow-up at this point.

## 2019-07-15 NOTE — Assessment & Plan Note (Signed)
Continue statin. 

## 2019-08-07 ENCOUNTER — Other Ambulatory Visit: Payer: Self-pay | Admitting: Family Medicine

## 2019-08-07 ENCOUNTER — Encounter: Payer: Self-pay | Admitting: Family Medicine

## 2019-08-07 DIAGNOSIS — M549 Dorsalgia, unspecified: Secondary | ICD-10-CM

## 2019-08-08 MED ORDER — TRAMADOL HCL 50 MG PO TABS: 100.0000 mg | ORAL_TABLET | Freq: Four times a day (QID) | ORAL | 0 refills | Status: DC

## 2019-08-08 MED ORDER — HYDROCODONE-ACETAMINOPHEN 10-325 MG PO TABS: ORAL_TABLET | ORAL | 0 refills | Status: DC

## 2019-08-08 NOTE — Telephone Encounter (Signed)
Name of Medication: Hydrocodone and Tramadol Name of Pharmacy: [facility] Last Fill or Written Date and Quantity: 07/13/19  Hydrocodone  #90, Tramadol #240 Last Office Visit and Type: 07/12/19 Next Office Visit and Type: none scheduled Last Controlled Substance Agreement Date: 10/03/18 Last UDS: 01/17/15

## 2019-08-08 NOTE — Telephone Encounter (Signed)
ERx 

## 2019-08-13 ENCOUNTER — Other Ambulatory Visit: Payer: Self-pay | Admitting: Family Medicine

## 2019-08-13 NOTE — Telephone Encounter (Signed)
Electronic refill request. Amitriptyline Last office visit:   07/12/2019 Last Filled:    90 tablet 3 04/26/2019  Please advise.

## 2019-08-14 NOTE — Telephone Encounter (Signed)
Sent. Thanks.   

## 2019-09-05 ENCOUNTER — Other Ambulatory Visit: Payer: Self-pay | Admitting: Family Medicine

## 2019-09-05 DIAGNOSIS — M549 Dorsalgia, unspecified: Secondary | ICD-10-CM

## 2019-09-06 NOTE — Telephone Encounter (Signed)
Last office visit 07/12/2019 for chronic pain management.  Last refilled 08/08/2019 on both medications.  UDS/Contract 10/03/2018.  No future appointments.

## 2019-09-07 MED ORDER — TRAMADOL HCL 50 MG PO TABS: 100.0000 mg | ORAL_TABLET | Freq: Four times a day (QID) | ORAL | 0 refills | Status: DC

## 2019-09-07 MED ORDER — HYDROCODONE-ACETAMINOPHEN 10-325 MG PO TABS: ORAL_TABLET | ORAL | 0 refills | Status: DC

## 2019-09-07 NOTE — Telephone Encounter (Signed)
Sent. Thanks.   

## 2019-09-10 ENCOUNTER — Other Ambulatory Visit: Payer: Self-pay | Admitting: Family Medicine

## 2019-09-13 ENCOUNTER — Encounter: Payer: Self-pay | Admitting: Family Medicine

## 2019-09-18 ENCOUNTER — Telehealth: Payer: Self-pay | Admitting: Family Medicine

## 2019-09-18 NOTE — Telephone Encounter (Signed)
This is pts pt message to Dr Para March on 09/13/19.  Hey Dr Para March.  I may be off base here.  5 months before my Dad died, he went from drinking a bunch of coffee a day, to not liking the taste.  We assume the cancer did something to him. Well, I have a small dull pain just under my right ribs, and for the last few days, I went from drinking 3 or 4 sodas a day, to not liking the taste.  I could be being silly, but if I didn't get it checked, I'd worry until I did.  Are there blood tests, or scans they can do to catch something early (worst case)?  I put a red dot where it hurts.  Attachments    I left v/m requesting pt to call [facility] [phone] opt 4. And Dr Para March sent pt message back to pt that someone would be calling pt. On mychart pt message on 09/13/19 pt message note.

## 2019-09-18 NOTE — Telephone Encounter (Signed)
See mychart message, please triage patient about upper abd discomfort and taste changes.    Please ask routine questions along with inquiry about fevers, stools changes, change in discomfort with eating certain meals, etc.  Thanks.

## 2019-09-18 NOTE — Telephone Encounter (Addendum)
I left v/m requesting pt to cb to get some more details about pt condition.

## 2019-09-19 NOTE — Telephone Encounter (Signed)
I spoke with pt;pt said for about one wk pts taste has changed in regards to drinking soft drinks. Pt can still taste and smell but soft drinks taste flat to pt as if had lost carbonation. Also same time frame pt has dull pain on and off rt upper abd under rib cage. Pain level now is 1-2.  No fever, chills, diarrhea, constipation and does not have any problem drinking or eating. Pt had normal BM on 09/18/19;pt did not see any blood or mucus. Pt said he does have hiatal hernia. Pt has no covid symptoms, no travel and no known exposure to + covid. Pt lives in [place] but will be in [place] area on 09/24/19; pt would feel better for Dr Para March to see pt and see if needs any testing or if anything needs to be done;that would ease pts mind and pt scheduled in office visit with Dr Para March on 09/24/19 at 11:30. UC & ED precautions given and pt voiced understanding. FYI to Dr Para March.

## 2019-09-19 NOTE — Telephone Encounter (Signed)
I think this makes sense. Many thanks.

## 2019-09-19 NOTE — Telephone Encounter (Signed)
I left v/m for pt to cb. 

## 2019-09-24 ENCOUNTER — Ambulatory Visit (INDEPENDENT_AMBULATORY_CARE_PROVIDER_SITE_OTHER): Payer: PRIVATE HEALTH INSURANCE | Admitting: Family Medicine

## 2019-09-24 ENCOUNTER — Other Ambulatory Visit: Payer: Self-pay

## 2019-09-24 ENCOUNTER — Encounter: Payer: Self-pay | Admitting: Family Medicine

## 2019-09-24 VITALS — BP 132/82 | HR 93 | Temp 97.9°F | Ht 74.0 in | Wt 217.0 lb

## 2019-09-24 DIAGNOSIS — R1310 Dysphagia, unspecified: Secondary | ICD-10-CM | POA: Diagnosis not present

## 2019-09-24 DIAGNOSIS — R109 Unspecified abdominal pain: Secondary | ICD-10-CM | POA: Diagnosis not present

## 2019-09-24 LAB — CBC WITH DIFFERENTIAL/PLATELET
Basophils Absolute: 0.1 10*3/uL (ref 0.0–0.1)
Basophils Relative: 1.2 % (ref 0.0–3.0)
Eosinophils Absolute: 0.9 10*3/uL — ABNORMAL HIGH (ref 0.0–0.7)
Eosinophils Relative: 15.1 % — ABNORMAL HIGH (ref 0.0–5.0)
HCT: 40.1 % (ref 39.0–52.0)
Hemoglobin: 13.5 g/dL (ref 13.0–17.0)
Lymphocytes Relative: 27 % (ref 12.0–46.0)
Lymphs Abs: 1.6 10*3/uL (ref 0.7–4.0)
MCHC: 33.8 g/dL (ref 30.0–36.0)
MCV: 90.3 fl (ref 78.0–100.0)
Monocytes Absolute: 0.5 10*3/uL (ref 0.1–1.0)
Monocytes Relative: 8.4 % (ref 3.0–12.0)
Neutro Abs: 2.9 10*3/uL (ref 1.4–7.7)
Neutrophils Relative %: 48.3 % (ref 43.0–77.0)
Platelets: 297 10*3/uL (ref 150.0–400.0)
RBC: 4.44 Mil/uL (ref 4.22–5.81)
RDW: 13.5 % (ref 11.5–15.5)
WBC: 6 10*3/uL (ref 4.0–10.5)

## 2019-09-24 LAB — COMPREHENSIVE METABOLIC PANEL
ALT: 27 U/L (ref 0–53)
AST: 20 U/L (ref 0–37)
Albumin: 4.7 g/dL (ref 3.5–5.2)
Alkaline Phosphatase: 74 U/L (ref 39–117)
BUN: 11 mg/dL (ref 6–23)
CO2: 31 mEq/L (ref 19–32)
Calcium: 9.2 mg/dL (ref 8.4–10.5)
Chloride: 102 mEq/L (ref 96–112)
Creatinine, Ser: 0.93 mg/dL (ref 0.40–1.50)
GFR: 88.29 mL/min (ref >60.00)
Glucose, Bld: 100 mg/dL — ABNORMAL HIGH (ref 70–99)
Potassium: 4.1 mEq/L (ref 3.5–5.1)
Sodium: 139 mEq/L (ref 135–145)
Total Bilirubin: 0.4 mg/dL (ref 0.2–1.2)
Total Protein: 7.5 g/dL (ref 6.0–8.3)

## 2019-09-24 NOTE — Patient Instructions (Signed)
Go to the lab on the way out.   If you have mychart we'll likely use that to update you.    Let me see about getting a barium swallow test done down in [facility].  Take care.  Glad to see you.

## 2019-09-24 NOTE — Progress Notes (Signed)
This visit occurred during the SARS-CoV-2 public health emergency.  Safety protocols were in place, including screening questions prior to the visit, additional usage of staff PPE, and extensive cleaning of exam room while observing appropriate contact time as indicated for disinfecting solutions.  abd sx. He had some upper abd pain that moved the L side then resolved.  No abdominal pain now.  He noted tastes changes but it may have been the soda that was flat/defective and truly tasted different, not his lost of taste per se.  He could still taste other foods normally.  He does drink soda frequently.  No vomiting except for once when he ate something that wasn't fully chewed and it didn't go down properly (he has GI f/u pending).  No blood in stool. No black stools.  One stool with mucous a few days ago, isolated event.  No pain now.  Taste is normal now.    His father had taste changes prior to an illness.  Discussed.  We talked about potentially getting modified barium swallow prior to his evaluation with GI in [place], [place].    Meds, vitals, and allergies reviewed.   ROS: Per HPI unless specifically indicated in ROS section   GEN: nad, alert and oriented HEENT: ncat NECK: supple w/o LA CV: rrr.  PULM: ctab, no inc wob ABD: soft, +bs, not tender to palpation. EXT: no edema SKIN: no acute rash, scar noted on lower back at baseline.

## 2019-09-26 NOTE — Assessment & Plan Note (Signed)
The taste change may have been related to some flat/defective soda.  His abdominal pain is resolved in the meantime.  I do not see any ominous process at this point.  Reasonable to check routine labs but if those are normal then no other work-up needed regarding his episode of abdominal discomfort.    There is the issue of dysphagia and he is going to follow-up with GI in [facility].  It is reasonable to try to get a modified barium swallow set up in the meantime.

## 2019-09-27 ENCOUNTER — Encounter: Payer: Self-pay | Admitting: Family Medicine

## 2019-09-27 DIAGNOSIS — R7989 Other specified abnormal findings of blood chemistry: Secondary | ICD-10-CM

## 2019-10-04 NOTE — Telephone Encounter (Signed)
Please make sure his letter printed.  Please pull it for me to sign and then mail to patient.  Thanks.

## 2019-10-05 ENCOUNTER — Other Ambulatory Visit: Payer: Self-pay | Admitting: Family Medicine

## 2019-10-05 ENCOUNTER — Encounter: Payer: Self-pay | Admitting: Family Medicine

## 2019-10-05 DIAGNOSIS — M549 Dorsalgia, unspecified: Secondary | ICD-10-CM

## 2019-10-08 ENCOUNTER — Other Ambulatory Visit: Payer: Self-pay | Admitting: Family Medicine

## 2019-10-08 DIAGNOSIS — M549 Dorsalgia, unspecified: Secondary | ICD-10-CM

## 2019-10-08 MED ORDER — HYDROCODONE-ACETAMINOPHEN 10-325 MG PO TABS: ORAL_TABLET | ORAL | 0 refills | Status: DC

## 2019-10-08 MED ORDER — TRAMADOL HCL 50 MG PO TABS: 100.0000 mg | ORAL_TABLET | Freq: Four times a day (QID) | ORAL | 0 refills | Status: DC

## 2019-10-16 ENCOUNTER — Encounter: Payer: Self-pay | Admitting: Family Medicine

## 2019-10-17 ENCOUNTER — Other Ambulatory Visit: Payer: Self-pay | Admitting: Family Medicine

## 2019-10-17 ENCOUNTER — Encounter: Payer: Self-pay | Admitting: Family Medicine

## 2019-10-17 DIAGNOSIS — R1310 Dysphagia, unspecified: Secondary | ICD-10-CM

## 2019-10-24 ENCOUNTER — Telehealth: Payer: Self-pay | Admitting: Family Medicine

## 2019-10-24 NOTE — Telephone Encounter (Signed)
Patient went to see Gastroenterologist and he is scheduled to have an Endoscopy and Colonoscopy so he says he doesn't need the Barium swallow test anymore, please cancel the order. He will have the GI Dr send you the results.

## 2019-10-24 NOTE — Telephone Encounter (Signed)
Done. Thanks.  Will await GI note.

## 2019-11-04 ENCOUNTER — Other Ambulatory Visit: Payer: Self-pay | Admitting: Family Medicine

## 2019-11-04 DIAGNOSIS — M549 Dorsalgia, unspecified: Secondary | ICD-10-CM

## 2019-11-06 MED ORDER — TRAMADOL HCL 50 MG PO TABS: 100.0000 mg | ORAL_TABLET | Freq: Four times a day (QID) | ORAL | 0 refills | Status: DC

## 2019-11-06 MED ORDER — HYDROCODONE-ACETAMINOPHEN 10-325 MG PO TABS: ORAL_TABLET | ORAL | 0 refills | Status: DC

## 2019-11-06 NOTE — Telephone Encounter (Signed)
Sent. Thanks.   

## 2019-11-27 ENCOUNTER — Encounter: Payer: Self-pay | Admitting: Family Medicine

## 2019-11-27 LAB — HM COLONOSCOPY

## 2019-11-30 ENCOUNTER — Other Ambulatory Visit: Payer: Self-pay | Admitting: Family Medicine

## 2019-11-30 DIAGNOSIS — M549 Dorsalgia, unspecified: Secondary | ICD-10-CM

## 2019-12-01 ENCOUNTER — Encounter: Payer: Self-pay | Admitting: Family Medicine

## 2019-12-03 ENCOUNTER — Other Ambulatory Visit: Payer: Self-pay | Admitting: Family Medicine

## 2019-12-03 DIAGNOSIS — M549 Dorsalgia, unspecified: Secondary | ICD-10-CM

## 2019-12-03 NOTE — Telephone Encounter (Signed)
Electronic refill request. Tramadol Last office visit:   09/24/2019 Last Filled:    240 tablet 0 11/06/2019   Name of Medication:  Hydrocodone Name of Pharmacy: [facility] Last Fill or Written Date and Quantity:   90 tablet 0 11/06/2019  Last Office Visit and Type: 09/24/2019 Next Office Visit and Type: None Last Controlled Substance Agreement Date: 07/18/2014 Last UDS: 03/18/2017

## 2019-12-04 ENCOUNTER — Other Ambulatory Visit: Payer: Self-pay | Admitting: Family Medicine

## 2019-12-04 MED ORDER — HYDROCODONE-ACETAMINOPHEN 10-325 MG PO TABS: ORAL_TABLET | ORAL | 0 refills | Status: DC

## 2019-12-04 MED ORDER — TRAMADOL HCL 50 MG PO TABS: 100.0000 mg | ORAL_TABLET | Freq: Four times a day (QID) | ORAL | 0 refills | Status: DC

## 2019-12-04 NOTE — Telephone Encounter (Signed)
Sent. Thanks.   

## 2019-12-04 NOTE — Telephone Encounter (Signed)
Electronic refill request. Amitriptyline Last office visit:   09/24/2019 Last Filled:    90 tablet 3 08/14/2019

## 2019-12-04 NOTE — Telephone Encounter (Signed)
rx sent via other message, see mychart message.

## 2019-12-05 NOTE — Telephone Encounter (Signed)
Sent. Thanks.   

## 2019-12-28 ENCOUNTER — Other Ambulatory Visit: Payer: Self-pay | Admitting: Family Medicine

## 2019-12-28 DIAGNOSIS — M549 Dorsalgia, unspecified: Secondary | ICD-10-CM

## 2019-12-31 NOTE — Telephone Encounter (Signed)
Name of Medication: Hydrocodone Name of Pharmacy: [facility] Last Fill or Written Date and Quantity: 12-04-19 #90 Last Office Visit and Type: 09-24-19 Next Office Visit and Type: No Future OV Last Controlled Substance Agreement Date: N/A Last UDS: 10-03-18  Tramadol last written 12-04-19 #240

## 2020-01-01 MED ORDER — HYDROCODONE-ACETAMINOPHEN 10-325 MG PO TABS: ORAL_TABLET | ORAL | 0 refills | Status: DC

## 2020-01-01 MED ORDER — TRAMADOL HCL 50 MG PO TABS: 100.0000 mg | ORAL_TABLET | Freq: Four times a day (QID) | ORAL | 0 refills | Status: DC

## 2020-01-01 NOTE — Telephone Encounter (Signed)
Sent. Thanks.   

## 2020-01-10 ENCOUNTER — Encounter: Payer: Self-pay | Admitting: Family Medicine

## 2020-01-27 ENCOUNTER — Other Ambulatory Visit: Payer: Self-pay | Admitting: Family Medicine

## 2020-01-27 ENCOUNTER — Encounter: Payer: Self-pay | Admitting: Family Medicine

## 2020-01-27 DIAGNOSIS — M549 Dorsalgia, unspecified: Secondary | ICD-10-CM

## 2020-01-28 NOTE — Telephone Encounter (Signed)
Name of Medication: Hydrocodone Marcia Brash Name of Pharmacy: [facility] Last Fill or Written Date and Quantity: 01/01/20 #90 Last Office Visit and Type: 09/24/19 Next Office Visit and Type: None scheduled Last Controlled Substance Agreement Date: 01/17/15 Last UDS:10/03/18 Tramadol last refill 01/01/20

## 2020-01-29 MED ORDER — HYDROCODONE-ACETAMINOPHEN 10-325 MG PO TABS: ORAL_TABLET | ORAL | 0 refills | Status: DC

## 2020-01-29 MED ORDER — TRAMADOL HCL 50 MG PO TABS: 100.0000 mg | ORAL_TABLET | Freq: Four times a day (QID) | ORAL | 0 refills | Status: DC

## 2020-01-29 NOTE — Telephone Encounter (Signed)
Sent. Thanks.   

## 2020-02-03 ENCOUNTER — Encounter: Payer: Self-pay | Admitting: Family Medicine

## 2020-02-06 ENCOUNTER — Other Ambulatory Visit: Payer: Self-pay | Admitting: Family Medicine

## 2020-02-06 DIAGNOSIS — Z8249 Family history of ischemic heart disease and other diseases of the circulatory system: Secondary | ICD-10-CM

## 2020-02-07 NOTE — Telephone Encounter (Signed)
Attempted to complete this precert and the insurance is wanting firm documentation and clinical data/supporting documentation.  The patient has not been seen since June 2021 and has not had a recent face-to-face encounter/ physical exam which will lead to higher probability of denial of this claim. If denied them I will not be able to submit another claim for a certain period of time or until the patient completes the recommended conservative treatment first. It is best that the patient is seen in office to have OV notes to support this referral/order - insurance is very finicky when it comes to approving scans, especially if they have not been seen in almost 6 months.   Please advise what you would like for me to do? Complete the precert with the Mychart message that I have in the chart or if you want to see the patient in office for face-to-face encounter.  Thanks!

## 2020-02-07 NOTE — Telephone Encounter (Signed)
Can you talk to patient about this and get him scheduled?  Please let me know.  Thanks.

## 2020-02-12 NOTE — Telephone Encounter (Signed)
Two attempts to reach the patient by telephone and also by Mychart message - no response.  No voicemail left as the VM states that he does not check his messages.

## 2020-02-15 NOTE — Telephone Encounter (Signed)
Dr Para March,   Pt is wanting to know how you feel about him getting this scan - is it necessary?   Is it something that since we are having issues getting approved can wait until 59mo-41yr from now?  Pt is okay with pushing this out if Dr Para March agrees with this plan.  Please advise, thanks.

## 2020-02-19 NOTE — Telephone Encounter (Signed)
Called and spoke with patient per Dr. Para March. Patient verbalized understanding and stated that he would schedule an appointment to see Dr. Para March early 2022 via MyChart.

## 2020-02-22 ENCOUNTER — Other Ambulatory Visit: Payer: Self-pay | Admitting: Family Medicine

## 2020-02-22 DIAGNOSIS — M549 Dorsalgia, unspecified: Secondary | ICD-10-CM

## 2020-02-25 ENCOUNTER — Encounter: Payer: Self-pay | Admitting: Family Medicine

## 2020-02-25 MED ORDER — HYDROCODONE-ACETAMINOPHEN 10-325 MG PO TABS: ORAL_TABLET | ORAL | 0 refills | Status: DC

## 2020-02-25 MED ORDER — TRAMADOL HCL 50 MG PO TABS: 100.0000 mg | ORAL_TABLET | Freq: Four times a day (QID) | ORAL | 0 refills | Status: DC

## 2020-02-25 NOTE — Telephone Encounter (Signed)
Name of Medication: Hydrocodone-Acetaminophen 10-325 mg  Name of Pharmacy: [facility]  Last Fill or Written Date and Quantity: 01/27/2020; Quantity: 90 Last Office Visit and Type: 09/24/2019 Next Office Visit and Type: Not Scheduled  Last Controlled Substance Agreement Date: 01/17/2015 Last UDS: 10/03/2018  Pharmacy requests refill on: Tramadol 50 mg   LAST REFILL: 01/27/2020 LAST OV: 09/24/2019 NEXT OV: Not Scheduled  PHARMACY: [facility]

## 2020-02-25 NOTE — Telephone Encounter (Signed)
Sent. Thanks.   

## 2020-02-26 ENCOUNTER — Other Ambulatory Visit: Payer: Self-pay | Admitting: Family Medicine

## 2020-03-20 ENCOUNTER — Encounter: Payer: Self-pay | Admitting: Family Medicine

## 2020-03-21 ENCOUNTER — Other Ambulatory Visit: Payer: Self-pay | Admitting: Family Medicine

## 2020-03-21 DIAGNOSIS — M549 Dorsalgia, unspecified: Secondary | ICD-10-CM

## 2020-03-23 ENCOUNTER — Other Ambulatory Visit: Payer: Self-pay | Admitting: Family Medicine

## 2020-03-23 DIAGNOSIS — M549 Dorsalgia, unspecified: Secondary | ICD-10-CM

## 2020-03-23 MED ORDER — HYDROCODONE-ACETAMINOPHEN 10-325 MG PO TABS: ORAL_TABLET | ORAL | 0 refills | Status: DC

## 2020-03-23 MED ORDER — ATORVASTATIN CALCIUM 40 MG PO TABS: 40.0000 mg | ORAL_TABLET | Freq: Every day | ORAL | 1 refills | Status: DC

## 2020-03-23 MED ORDER — TRAMADOL HCL 50 MG PO TABS: 100.0000 mg | ORAL_TABLET | Freq: Four times a day (QID) | ORAL | 0 refills | Status: DC

## 2020-03-24 ENCOUNTER — Other Ambulatory Visit: Payer: Self-pay | Admitting: Family Medicine

## 2020-03-30 ENCOUNTER — Encounter: Payer: Self-pay | Admitting: Family Medicine

## 2020-04-01 NOTE — Telephone Encounter (Signed)
FYI

## 2020-04-08 ENCOUNTER — Encounter: Payer: Self-pay | Admitting: Family Medicine

## 2020-04-18 ENCOUNTER — Other Ambulatory Visit: Payer: Self-pay | Admitting: Family Medicine

## 2020-04-18 DIAGNOSIS — M549 Dorsalgia, unspecified: Secondary | ICD-10-CM

## 2020-04-18 MED ORDER — TRAMADOL HCL 50 MG PO TABS: 100.0000 mg | ORAL_TABLET | Freq: Four times a day (QID) | ORAL | 0 refills | Status: DC

## 2020-04-18 MED ORDER — HYDROCODONE-ACETAMINOPHEN 10-325 MG PO TABS: ORAL_TABLET | ORAL | 0 refills | Status: DC

## 2020-04-21 ENCOUNTER — Other Ambulatory Visit: Payer: Self-pay | Admitting: Family Medicine

## 2020-05-06 ENCOUNTER — Encounter: Payer: Self-pay | Admitting: Family Medicine

## 2020-05-06 ENCOUNTER — Other Ambulatory Visit: Payer: Self-pay

## 2020-05-06 ENCOUNTER — Ambulatory Visit (INDEPENDENT_AMBULATORY_CARE_PROVIDER_SITE_OTHER): Payer: PRIVATE HEALTH INSURANCE | Admitting: Family Medicine

## 2020-05-06 VITALS — BP 112/70 | HR 78 | Temp 97.6°F | Ht 74.0 in | Wt 231.0 lb

## 2020-05-06 DIAGNOSIS — R319 Hematuria, unspecified: Secondary | ICD-10-CM

## 2020-05-06 DIAGNOSIS — Z8249 Family history of ischemic heart disease and other diseases of the circulatory system: Secondary | ICD-10-CM | POA: Diagnosis not present

## 2020-05-06 DIAGNOSIS — M545 Low back pain, unspecified: Secondary | ICD-10-CM

## 2020-05-06 DIAGNOSIS — M549 Dorsalgia, unspecified: Secondary | ICD-10-CM | POA: Diagnosis not present

## 2020-05-06 DIAGNOSIS — Z23 Encounter for immunization: Secondary | ICD-10-CM | POA: Diagnosis not present

## 2020-05-06 DIAGNOSIS — E782 Mixed hyperlipidemia: Secondary | ICD-10-CM

## 2020-05-06 DIAGNOSIS — L989 Disorder of the skin and subcutaneous tissue, unspecified: Secondary | ICD-10-CM

## 2020-05-06 DIAGNOSIS — G8929 Other chronic pain: Secondary | ICD-10-CM

## 2020-05-06 LAB — COMPREHENSIVE METABOLIC PANEL
ALT: 25 U/L (ref 0–53)
AST: 19 U/L (ref 0–37)
Albumin: 4.4 g/dL (ref 3.5–5.2)
Alkaline Phosphatase: 76 U/L (ref 39–117)
BUN: 11 mg/dL (ref 6–23)
CO2: 34 mEq/L — ABNORMAL HIGH (ref 19–32)
Calcium: 9.2 mg/dL (ref 8.4–10.5)
Chloride: 102 mEq/L (ref 96–112)
Creatinine, Ser: 1.05 mg/dL (ref 0.40–1.50)
GFR: 86.3 mL/min (ref >60.00)
Glucose, Bld: 88 mg/dL (ref 70–99)
Potassium: 4.3 mEq/L (ref 3.5–5.1)
Sodium: 139 mEq/L (ref 135–145)
Total Bilirubin: 0.4 mg/dL (ref 0.2–1.2)
Total Protein: 7.1 g/dL (ref 6.0–8.3)

## 2020-05-06 LAB — CBC WITH DIFFERENTIAL/PLATELET
Basophils Absolute: 0.1 10*3/uL (ref 0.0–0.1)
Basophils Relative: 0.8 % (ref 0.0–3.0)
Eosinophils Absolute: 0.9 10*3/uL — ABNORMAL HIGH (ref 0.0–0.7)
Eosinophils Relative: 12.3 % — ABNORMAL HIGH (ref 0.0–5.0)
HCT: 38.9 % — ABNORMAL LOW (ref 39.0–52.0)
Hemoglobin: 13.2 g/dL (ref 13.0–17.0)
Lymphocytes Relative: 21 % (ref 12.0–46.0)
Lymphs Abs: 1.5 10*3/uL (ref 0.7–4.0)
MCHC: 33.9 g/dL (ref 30.0–36.0)
MCV: 89.2 fl (ref 78.0–100.0)
Monocytes Absolute: 0.5 10*3/uL (ref 0.1–1.0)
Monocytes Relative: 6.3 % (ref 3.0–12.0)
Neutro Abs: 4.3 10*3/uL (ref 1.4–7.7)
Neutrophils Relative %: 59.6 % (ref 43.0–77.0)
Platelets: 273 10*3/uL (ref 150.0–400.0)
RBC: 4.36 Mil/uL (ref 4.22–5.81)
RDW: 13 % (ref 11.5–15.5)
WBC: 7.2 10*3/uL (ref 4.0–10.5)

## 2020-05-06 LAB — URINALYSIS, ROUTINE W REFLEX MICROSCOPIC
Bilirubin Urine: NEGATIVE
Hgb urine dipstick: NEGATIVE
Ketones, ur: NEGATIVE
Leukocytes,Ua: NEGATIVE
Nitrite: NEGATIVE
Specific Gravity, Urine: 1.01 (ref 1.000–1.030)
Total Protein, Urine: NEGATIVE
Urine Glucose: NEGATIVE
Urobilinogen, UA: 0.2 (ref 0.0–1.0)
pH: 7 (ref 5.0–8.0)

## 2020-05-06 LAB — LIPID PANEL
Cholesterol: 189 mg/dL (ref 0–200)
HDL: 53.8 mg/dL (ref >39.00)
LDL Cholesterol: 100 mg/dL — ABNORMAL HIGH (ref 0–99)
NonHDL: 134.95
Total CHOL/HDL Ratio: 4
Triglycerides: 174 mg/dL — ABNORMAL HIGH (ref 0.0–149.0)
VLDL: 34.8 mg/dL (ref 0.0–40.0)

## 2020-05-06 NOTE — Patient Instructions (Signed)
Go to the lab on the way out.   If you have mychart we'll likely use that to update you.    Take care.  Glad to see you. We'll see about setting up the dermatology appointment.

## 2020-05-06 NOTE — Progress Notes (Signed)
This visit occurred during the SARS-CoV-2 public health emergency.  Safety protocols were in place, including screening questions prior to the visit, additional usage of staff PPE, and extensive cleaning of exam room while observing appropriate contact time as indicated for disinfecting solutions.  He has root canal pending for tomorrow.    He had covid vaccine.  Flu shot today.   Blood in urine previously noted with previous evaluation through urology discussed.    He had u/s done.   FINDINGS:   RIGHT KIDNEY: Measures 10.4 cm. Normal echogenicity. No focal mass. No hydronephrosis. No nephrolithiasis.   LEFT KIDNEY: Measures 11.9 cm. Normal echogenicity. No focal mass. No hydronephrosis. No nephrolithiasis.  BLADDER: Normal in appearance and wall-thickness. Fluid is present in the urinary bladder, calculated to be 48 mL.  He has cystoscopy pending in the near future.  No blood in urine now per patient report.  He doesn't recall passing a stone.    Elevated Cholesterol: Using medications without problems: yes Muscle aches: not from statin.   Diet compliance: yes Exercise: yes  Chronic pain.  He is still active.  He is stretching and back to working out.  No ADE on med.  Not drowsy.  He is able to be active with pain meds.  He has good control of pain but still isn't pain free.    Skin lesion.   Nevus on the back, 4x62mm with light and dark pigment.  FH melanoma.   Aneurysm screening d/w pt. possibility of CTA brain d/w pt.  FH with mother and mother's sister.  He has h/o R occipital HA, throbbing, resolved in the meantime.  He has some blurry vision with reading (distance is okay) but not double vision.  No recent eye exam.    His wife needed appendix surgery.  D/w pt.    Meds, vitals, and allergies reviewed.   ROS: Per HPI unless specifically indicated in ROS section   GEN: nad, alert and oriented HEENT: ncat NECK: supple w/o LA CV: rrr. PULM: ctab, no inc wob ABD: soft,  +bs EXT: no edema SKIN: no acute rash but Nevus on the back, 4x76mm with light and dark pigment.   CN 2-12 wnl B, S/S/DTR wnl x4 Old surgical scar noted on the lower back.

## 2020-05-07 DIAGNOSIS — R319 Hematuria, unspecified: Secondary | ICD-10-CM | POA: Insufficient documentation

## 2020-05-07 DIAGNOSIS — Z8249 Family history of ischemic heart disease and other diseases of the circulatory system: Secondary | ICD-10-CM | POA: Insufficient documentation

## 2020-05-07 NOTE — Assessment & Plan Note (Signed)
Able to tolerate statin.  See notes on follow-up labs.  Continue Lipitor.

## 2020-05-07 NOTE — Assessment & Plan Note (Signed)
He has been able to be functional with his current pain medication.  We did not change his medication at this point.  He is not sedated.  He still having daily pain but it is manageable enough to where he can be active with his children.  He is trying to stretch and be as active as possible/exercise.  I think it makes sense to continue as is for now.  He agrees.

## 2020-05-07 NOTE — Assessment & Plan Note (Signed)
With urology follow-up pending.  I will await cystoscopy report.

## 2020-05-07 NOTE — Assessment & Plan Note (Signed)
This may only be a benign or minimally dysplastic nevus but given his family history of melanoma I think it makes sense for him to see dermatology anyway.  We talked about it.  I put in the referral.

## 2020-05-08 ENCOUNTER — Encounter: Payer: Self-pay | Admitting: Family Medicine

## 2020-05-08 LAB — DRUG MONITORING, PANEL 8 WITH CONFIRMATION, URINE
6 Acetylmorphine: NEGATIVE ng/mL (ref <10)
Alcohol Metabolites: NEGATIVE ng/mL
Amphetamines: NEGATIVE ng/mL (ref <500)
Benzodiazepines: NEGATIVE ng/mL (ref <100)
Buprenorphine, Urine: NEGATIVE ng/mL (ref <5)
Cocaine Metabolite: NEGATIVE ng/mL (ref <150)
Codeine: NEGATIVE ng/mL (ref <50)
Creatinine: 106.4 mg/dL
Hydrocodone: 267 ng/mL — ABNORMAL HIGH (ref <50)
Hydromorphone: 203 ng/mL — ABNORMAL HIGH (ref <50)
MDMA: NEGATIVE ng/mL (ref <500)
Marijuana Metabolite: NEGATIVE ng/mL (ref <20)
Morphine: NEGATIVE ng/mL (ref <50)
Norhydrocodone: 1331 ng/mL — ABNORMAL HIGH (ref <50)
Opiates: POSITIVE ng/mL — AB (ref <100)
Oxidant: NEGATIVE ug/mL
Oxycodone: NEGATIVE ng/mL (ref <100)
pH: 7.2 (ref 4.5–9.0)

## 2020-05-08 LAB — DM TEMPLATE

## 2020-05-11 NOTE — Addendum Note (Signed)
Addended by: Joaquim Nam on: 05/11/2020 11:47 PM   Modules accepted: Level of Service

## 2020-05-13 ENCOUNTER — Telehealth: Payer: Self-pay | Admitting: Family Medicine

## 2020-05-13 NOTE — Telephone Encounter (Signed)
Note   ----- Message ----- From: Consuella Lose, CMA Sent: 05/13/2020  10:48 AM EST To: Lavonna Monarch Subject: FW: CTA precert                                 ----- Message ----- From: Joaquim Nam, MD Sent: 05/11/2020   1:45 PM EST To: Maisie Fus, CMA Subject: RE: CTA precert                                I need some help here.  Who can call the patient and explained this to him about the network coverage?  Will that affect his ability to follow-up here?  I did down code his visit as I do not want him to have to pay cash for the office visit.  Clelia Croft  ----- Message ----- From: Maisie Fus, CMA Sent: 05/08/2020   4:46 PM EST To: Joaquim Nam, MD Subject: CTA precert                                    Dr Para March,   Myself and another referral coordinator worked on this together to ensure we were correct...  -Called [facility] [phone], [phone] does require prior auth which you can get from [employer] (but not thru their portal) by calling [phone]. Dr. Para March is out of network and [facility] is out of network for the pts plan.   -Called [employer] to discuss Eligibility and Precert- Not able to pull up facilities we use as part of patients plan coverage - we are non-participating.  The patient will have to contact his insurance to see how much his OOP will be in order to have this done.   You nor our facilities are in the patients Insurance Network this year 2022.  It does not look like the patient was aware at his visit 05/06/20 that we were non-participating and we were unable to obtain insurance verification at the visit.    Fara Boros         I called and discussed this with patient. He was very understanding. He asked why all of a sudden we aren't in network and questioned why he hasn't had any problems in the past as he has had this insurance for a few years. I explained to him that insurance changes from year to year and it is most likely that  we aren't in network as of the first of this year. He stated he will give his insurance a call tomorrow and then follow up with me on what they say.

## 2020-05-14 ENCOUNTER — Other Ambulatory Visit: Payer: Self-pay | Admitting: Family Medicine

## 2020-05-14 DIAGNOSIS — M549 Dorsalgia, unspecified: Secondary | ICD-10-CM

## 2020-05-14 MED ORDER — HYDROCODONE-ACETAMINOPHEN 10-325 MG PO TABS: ORAL_TABLET | ORAL | 0 refills | Status: DC

## 2020-05-14 MED ORDER — TRAMADOL HCL 50 MG PO TABS: 100.0000 mg | ORAL_TABLET | Freq: Four times a day (QID) | ORAL | 0 refills | Status: DC

## 2020-05-16 ENCOUNTER — Other Ambulatory Visit: Payer: Self-pay | Admitting: Family Medicine

## 2020-05-16 DIAGNOSIS — M549 Dorsalgia, unspecified: Secondary | ICD-10-CM

## 2020-05-19 ENCOUNTER — Other Ambulatory Visit: Payer: Self-pay | Admitting: Family Medicine

## 2020-05-19 NOTE — Telephone Encounter (Signed)
Refill requests for Amitriptyline, Norco and Tramadol  LOV - 05/06/20 Next Ov - not scheduled Amitriptyline was last refilled 04/21/20 #90/0 Norco and Tramadol was refilled on 05/14/20

## 2020-05-20 MED ORDER — AMITRIPTYLINE HCL 10 MG PO TABS: 20.0000 mg | ORAL_TABLET | Freq: Every day | ORAL | 2 refills | Status: DC

## 2020-05-20 NOTE — Telephone Encounter (Signed)
Pharmacy did get the refills and filled them yesterday. They stated the advanced request shouldn't have come thru to [place]; was sent by mistake.

## 2020-05-20 NOTE — Telephone Encounter (Signed)
Please verify the pharmacy previously received Norco and tramadol.  I sent the amitriptyline prescription.  Thanks.

## 2020-06-10 ENCOUNTER — Other Ambulatory Visit: Payer: Self-pay | Admitting: Family Medicine

## 2020-06-10 DIAGNOSIS — M549 Dorsalgia, unspecified: Secondary | ICD-10-CM

## 2020-06-11 NOTE — Telephone Encounter (Signed)
Refill request for Norco 10-325 mg tablets and Tramadol 50 mg tablets.  LOV - 05/14/2020 Next OV - not scheduled Last refilled Norco 05/14/20 #90/0         Tramadol 05/14/20 #240/0

## 2020-06-15 MED ORDER — TRAMADOL HCL 50 MG PO TABS: 100.0000 mg | ORAL_TABLET | Freq: Four times a day (QID) | ORAL | 0 refills | Status: DC

## 2020-06-15 MED ORDER — HYDROCODONE-ACETAMINOPHEN 10-325 MG PO TABS: ORAL_TABLET | ORAL | 0 refills | Status: DC

## 2020-06-15 NOTE — Telephone Encounter (Signed)
Sent. Thanks.   

## 2020-06-23 ENCOUNTER — Other Ambulatory Visit: Payer: Self-pay | Admitting: Family Medicine

## 2020-07-06 ENCOUNTER — Telehealth: Payer: Self-pay | Admitting: Family Medicine

## 2020-07-06 NOTE — Telephone Encounter (Signed)
Maisie Fus, CMA  Joaquim Nam, MD  Dermatology office ([place], [place]) attempted to call patient to schedule appt - patient never returned call - they have left messages and sent a letter to schedule.   ========== Noted. Thanks.

## 2020-07-09 ENCOUNTER — Other Ambulatory Visit: Payer: Self-pay | Admitting: Family Medicine

## 2020-07-09 DIAGNOSIS — M549 Dorsalgia, unspecified: Secondary | ICD-10-CM

## 2020-07-10 NOTE — Telephone Encounter (Signed)
LAST APPOINTMENT DATE: 05/06/20 UDS 05/06/2020 No contract   NEXT APPOINTMENT DATE: Visit date not found  Norco   LAST REFILL: 06/15/2020  QTY: 90  Tramadol: Last refill: 06/15/2020 #240 no rf

## 2020-07-12 MED ORDER — TRAMADOL HCL 50 MG PO TABS: 100.0000 mg | ORAL_TABLET | Freq: Four times a day (QID) | ORAL | 0 refills | Status: DC

## 2020-07-12 MED ORDER — HYDROCODONE-ACETAMINOPHEN 10-325 MG PO TABS: ORAL_TABLET | ORAL | 0 refills | Status: DC

## 2020-07-12 NOTE — Telephone Encounter (Signed)
Sent. Thanks.   

## 2020-08-05 ENCOUNTER — Other Ambulatory Visit: Payer: Self-pay | Admitting: Family Medicine

## 2020-08-05 DIAGNOSIS — M549 Dorsalgia, unspecified: Secondary | ICD-10-CM

## 2020-08-06 MED ORDER — TRAMADOL HCL 50 MG PO TABS: 100.0000 mg | ORAL_TABLET | Freq: Four times a day (QID) | ORAL | 0 refills | Status: DC

## 2020-08-06 MED ORDER — HYDROCODONE-ACETAMINOPHEN 10-325 MG PO TABS
ORAL_TABLET | ORAL | 0 refills | Status: DC
Start: 2020-08-06 — End: 2020-08-06

## 2020-08-06 MED ORDER — HYDROCODONE-ACETAMINOPHEN 10-325 MG PO TABS: ORAL_TABLET | ORAL | 0 refills | Status: DC

## 2020-08-06 NOTE — Telephone Encounter (Signed)
Refill request for hydrocodone-acetaminophen 10-325 mg tablets and Tramadol 50 mg tablets.   LOV - 05/06/20 Next OV - not scheduled Last refill on Hydrocodone 07/12/20 #90/0 and tramadol 07/12/20 #240/0

## 2020-08-06 NOTE — Telephone Encounter (Signed)
I initially and mistakenly printed these 2 prescriptions.  Please shred those.  I resent them in the meantime.  Please check with patient.  What is his status regarding dermatology follow-up and also the plan regarding CT screening for aneurysm?  Please let me know.  Thanks.

## 2020-08-11 ENCOUNTER — Other Ambulatory Visit: Payer: Self-pay | Admitting: Family Medicine

## 2020-08-13 NOTE — Telephone Encounter (Signed)
LMTCB

## 2020-09-03 ENCOUNTER — Other Ambulatory Visit: Payer: Self-pay | Admitting: Family Medicine

## 2020-09-03 DIAGNOSIS — M549 Dorsalgia, unspecified: Secondary | ICD-10-CM

## 2020-09-04 ENCOUNTER — Encounter: Payer: Self-pay | Admitting: Family Medicine

## 2020-09-04 NOTE — Telephone Encounter (Signed)
Refill request for Norco 10-325 mg tablets and tramadol 50 mg tablets  LOV - 05/06/20 Next OV - not scheduled Last refill - 08/06/20 #240/0 on tramadol and 08/06/20 #90/0 on Norco

## 2020-09-07 MED ORDER — HYDROCODONE-ACETAMINOPHEN 10-325 MG PO TABS: ORAL_TABLET | ORAL | 0 refills | Status: DC

## 2020-09-07 MED ORDER — TRAMADOL HCL 50 MG PO TABS: 100.0000 mg | ORAL_TABLET | Freq: Four times a day (QID) | ORAL | 0 refills | Status: DC

## 2020-09-07 NOTE — Telephone Encounter (Signed)
Sent. Thanks.   

## 2020-10-02 ENCOUNTER — Other Ambulatory Visit: Payer: Self-pay | Admitting: Family Medicine

## 2020-10-02 DIAGNOSIS — M549 Dorsalgia, unspecified: Secondary | ICD-10-CM

## 2020-10-03 NOTE — Telephone Encounter (Signed)
Refill request for Norco and Tramadol   LOV - 05/06/20 Next OV - not scheduled Last refill - both refilled on 09/07/20; Norco #90/0 and Tramadol # 240/0

## 2020-10-06 ENCOUNTER — Other Ambulatory Visit: Payer: Self-pay | Admitting: Family Medicine

## 2020-10-06 MED ORDER — HYDROCODONE-ACETAMINOPHEN 10-325 MG PO TABS: ORAL_TABLET | ORAL | 0 refills | Status: DC

## 2020-10-06 MED ORDER — TRAMADOL HCL 50 MG PO TABS: 100.0000 mg | ORAL_TABLET | Freq: Four times a day (QID) | ORAL | 0 refills | Status: DC

## 2020-10-06 NOTE — Telephone Encounter (Signed)
Sent. Thanks.   

## 2020-10-13 ENCOUNTER — Telehealth: Payer: Self-pay | Admitting: Family Medicine

## 2020-10-13 NOTE — Telephone Encounter (Signed)
Left message to return call to our office.  

## 2020-10-13 NOTE — Telephone Encounter (Signed)
Please check with patient and see if he found out anything about in network follow up (both for imaging and follow up here).  I don't want him to get an exorbitant out of network charge for either.  Thanks.

## 2020-10-15 NOTE — Telephone Encounter (Signed)
Pt called returning your call 

## 2020-10-17 NOTE — Telephone Encounter (Signed)
Called patient he was not sure how to check. Informed him he needs to call his insurance and find out if we are in network and what imaging facility is. He will do so and give [place] a call back.

## 2020-10-19 NOTE — Telephone Encounter (Signed)
Agree. Thanks

## 2020-10-31 ENCOUNTER — Other Ambulatory Visit: Payer: Self-pay | Admitting: Family Medicine

## 2020-10-31 DIAGNOSIS — M549 Dorsalgia, unspecified: Secondary | ICD-10-CM

## 2020-11-02 MED ORDER — TRAMADOL HCL 50 MG PO TABS: 100.0000 mg | ORAL_TABLET | Freq: Four times a day (QID) | ORAL | 0 refills | Status: DC

## 2020-11-02 MED ORDER — HYDROCODONE-ACETAMINOPHEN 10-325 MG PO TABS: ORAL_TABLET | ORAL | 0 refills | Status: DC

## 2020-11-02 NOTE — Telephone Encounter (Signed)
Sent. Thanks.   

## 2020-11-03 ENCOUNTER — Telehealth (INDEPENDENT_AMBULATORY_CARE_PROVIDER_SITE_OTHER): Payer: PRIVATE HEALTH INSURANCE | Admitting: Family Medicine

## 2020-11-03 ENCOUNTER — Encounter: Payer: Self-pay | Admitting: Family Medicine

## 2020-11-03 VITALS — Ht 74.0 in | Wt 226.0 lb

## 2020-11-03 DIAGNOSIS — L989 Disorder of the skin and subcutaneous tissue, unspecified: Secondary | ICD-10-CM | POA: Diagnosis not present

## 2020-11-03 DIAGNOSIS — R519 Headache, unspecified: Secondary | ICD-10-CM

## 2020-11-03 DIAGNOSIS — Z8249 Family history of ischemic heart disease and other diseases of the circulatory system: Secondary | ICD-10-CM

## 2020-11-03 DIAGNOSIS — E782 Mixed hyperlipidemia: Secondary | ICD-10-CM

## 2020-11-03 NOTE — Progress Notes (Signed)
Virtual visit completed through WebEx or similar program Patient location: home  Provider location: [place] [facility], office  Participants: Patient and me (unless stated otherwise below)  Pandemic considerations d/w pt.   Limitations and rationale for visit method d/w patient.  Patient agreed to proceed.   CC: f/u  HPI:   His kids are doing well.  D/w pt.    No more blood in urine with prev eval done.    Mother had episode of altered speech recently.  She is in the midst of w/u.  She had h/o aneurysm.  D/w pt about brain imaging.  He has no vision loss.  Some occ HA from unclear source, noted at night, not in the day.  L forehead.  No numbness or tingling or weakness.  D/w pt about f/u imaging.    Skin lesion.  Prev exam with nevus on the back, 4x20mm with light and dark pigment.  needs referral to [facility], ordered.    No aches on statin,d/w pt.  He still has his baseline back pain w/o sedation on his med.  He is still trying to be active with pain related to level of activity.  We talked about nutrition eval for HLD.  Order placed.  Meds and allergies reviewed.   ROS: Per HPI unless specifically indicated in ROS section   NAD Speech wnl  A/P:  Headache.  Family history of cerebral aneurysm.  CT angio head ordered.  No acute symptoms that would direct emergency room evaluation currently.  Skin lesion.  Refer to dermatology.  Hyperlipidemia.  Refer for nutrition counseling.

## 2020-11-06 NOTE — Assessment & Plan Note (Signed)
Hyperlipidemia.  Refer for nutrition counseling.

## 2020-11-06 NOTE — Assessment & Plan Note (Signed)
Skin lesion.  Refer to dermatology.

## 2020-11-06 NOTE — Assessment & Plan Note (Signed)
Headache.  Family history of cerebral aneurysm.  CT angio head ordered.  No acute symptoms that would direct emergency room evaluation currently.

## 2020-11-18 ENCOUNTER — Encounter: Payer: Self-pay | Admitting: *Deleted

## 2020-11-27 ENCOUNTER — Other Ambulatory Visit: Payer: Self-pay | Admitting: Family Medicine

## 2020-11-27 DIAGNOSIS — M549 Dorsalgia, unspecified: Secondary | ICD-10-CM

## 2020-11-28 MED ORDER — TRAMADOL HCL 50 MG PO TABS: 100.0000 mg | ORAL_TABLET | Freq: Four times a day (QID) | ORAL | 0 refills | Status: DC

## 2020-11-28 MED ORDER — HYDROCODONE-ACETAMINOPHEN 10-325 MG PO TABS: ORAL_TABLET | ORAL | 0 refills | Status: DC

## 2020-11-28 NOTE — Telephone Encounter (Signed)
Sent. Thanks.   

## 2020-11-28 NOTE — Telephone Encounter (Signed)
Name of Medication: Hydrocodone/Tramadol Name of Pharmacy: [facility] Last Fill or Written Date and Quantity:Hydrocodone 11/02/20 #90 Tramadol 11/02/20 #240  Last Office Visit and Type: video 11/03/20 Next Office Visit and Type: none Last Controlled Substance Agreement Date:  Last UDS:05/06/20

## 2020-12-01 ENCOUNTER — Other Ambulatory Visit: Payer: Self-pay | Admitting: Family Medicine

## 2020-12-26 ENCOUNTER — Other Ambulatory Visit: Payer: Self-pay | Admitting: Family Medicine

## 2020-12-26 DIAGNOSIS — M549 Dorsalgia, unspecified: Secondary | ICD-10-CM

## 2020-12-26 NOTE — Telephone Encounter (Signed)
Refill request for Norco and Tramadol tablets  LOV - 11/03/20 Next OV - not scheduled Last refill - 11/28/20 for both; Norco #90/0 and Tramadol #240/0

## 2020-12-28 MED ORDER — TRAMADOL HCL 50 MG PO TABS: 100.0000 mg | ORAL_TABLET | Freq: Four times a day (QID) | ORAL | 0 refills | Status: DC

## 2020-12-28 MED ORDER — HYDROCODONE-ACETAMINOPHEN 10-325 MG PO TABS: ORAL_TABLET | ORAL | 0 refills | Status: DC

## 2020-12-28 NOTE — Telephone Encounter (Signed)
Sent.  What is the status of his CT angio?  I had put in the order previously.  Thanks.

## 2020-12-29 ENCOUNTER — Other Ambulatory Visit: Payer: Self-pay | Admitting: Family Medicine

## 2021-01-12 ENCOUNTER — Encounter: Payer: Self-pay | Admitting: Family Medicine

## 2021-01-13 NOTE — Telephone Encounter (Signed)
Letter done

## 2021-01-22 ENCOUNTER — Other Ambulatory Visit: Payer: Self-pay | Admitting: Family Medicine

## 2021-01-22 DIAGNOSIS — M549 Dorsalgia, unspecified: Secondary | ICD-10-CM

## 2021-01-23 MED ORDER — TRAMADOL HCL 50 MG PO TABS: 100.0000 mg | ORAL_TABLET | Freq: Four times a day (QID) | ORAL | 0 refills | Status: DC

## 2021-01-23 MED ORDER — HYDROCODONE-ACETAMINOPHEN 10-325 MG PO TABS: ORAL_TABLET | ORAL | 0 refills | Status: DC

## 2021-01-23 NOTE — Telephone Encounter (Signed)
Sent. Thanks.   

## 2021-01-23 NOTE — Telephone Encounter (Signed)
Refill request for Hydrocodone 10-325 mg tablets and tramadol 50 mg tablets  LOV - 11/03/20 Next OV - not scheduled Last refill - Hydrocodone - 12/28/20 #90/0        Tramadol - 12/28/20 #240/0

## 2021-01-25 ENCOUNTER — Other Ambulatory Visit: Payer: Self-pay | Admitting: Family Medicine

## 2021-02-20 ENCOUNTER — Encounter: Payer: Self-pay | Admitting: Family Medicine

## 2021-02-20 ENCOUNTER — Other Ambulatory Visit: Payer: Self-pay | Admitting: Family Medicine

## 2021-02-20 DIAGNOSIS — M549 Dorsalgia, unspecified: Secondary | ICD-10-CM

## 2021-02-23 MED ORDER — TRAMADOL HCL 50 MG PO TABS: 100.0000 mg | ORAL_TABLET | Freq: Four times a day (QID) | ORAL | 0 refills | Status: DC

## 2021-02-23 MED ORDER — HYDROCODONE-ACETAMINOPHEN 10-325 MG PO TABS: ORAL_TABLET | ORAL | 0 refills | Status: DC

## 2021-02-23 NOTE — Telephone Encounter (Signed)
Sent. Thanks.   

## 2021-02-23 NOTE — Telephone Encounter (Signed)
Refill request for Tramadol and Hydrocodone tablets  LOV - 11/03/20 Next OV - not scheduled Last refill - 01/23/21 #90/0 hydrocodone and #240/0 tramadol

## 2021-03-20 ENCOUNTER — Other Ambulatory Visit: Payer: Self-pay | Admitting: Family Medicine

## 2021-03-20 ENCOUNTER — Encounter: Payer: Self-pay | Admitting: Family Medicine

## 2021-03-20 DIAGNOSIS — M549 Dorsalgia, unspecified: Secondary | ICD-10-CM

## 2021-03-23 MED ORDER — TRAMADOL HCL 50 MG PO TABS: 100.0000 mg | ORAL_TABLET | Freq: Four times a day (QID) | ORAL | 0 refills | Status: DC

## 2021-03-23 MED ORDER — HYDROCODONE-ACETAMINOPHEN 10-325 MG PO TABS: ORAL_TABLET | ORAL | 0 refills | Status: DC

## 2021-03-23 NOTE — Telephone Encounter (Signed)
Sent. Thanks.   

## 2021-03-23 NOTE — Telephone Encounter (Signed)
Refill request for Hydrocodone and Tramadol   LOV - 11/03/20 Next OV - not scheduled Last refill - hydrocodone 02/23/21 #90/0        Tramadol 02/23/21 #240/0

## 2021-03-23 NOTE — Telephone Encounter (Signed)
Pt called to follow up on med refills

## 2021-03-24 ENCOUNTER — Encounter: Payer: Self-pay | Admitting: Family Medicine

## 2021-03-25 NOTE — Telephone Encounter (Signed)
Agreed.  Thanks.  

## 2021-03-25 NOTE — Telephone Encounter (Signed)
I spoke with pt; pt said starting on 03/23/21 has had intermittent sharp pain that is short lived but turns into a throbbing pain that last for "awhile" on lt lower side of abdomen and lt lower back. Pt has not seen blood in urine but thinks urine has a pink tinge to it. Pt said on 03/23/21 had burning and pain upon urination. Pt continues with frequency of urine. Last sharpe pain in lt lower side and back was last night but now pt continues with throbby pain in lt side and lower back. No fever. Pt is presently in [place]. Pt's wife works at hospital and pt has seen urologist before so pts wife is going to get pt an appt with urology or pt will go to UC for eval. UC & ED precautions given and pt voiced understanding and appreciative for the call. Sending note to Dr Para March and Shanda Bumps CMA as [place] Childes.

## 2021-04-01 ENCOUNTER — Telehealth: Payer: Self-pay | Admitting: Family Medicine

## 2021-04-01 NOTE — Telephone Encounter (Signed)
Spoke to pt. Having pain in lower left rib cage area. Scheduled to see urology 1 day next week. Has a hard time to start his stream. No dysuria. Went to [facility] 03-26-21 with hematuria. He will send a MyChart message to let Dr Para March know the exact date of the urology appt.

## 2021-04-01 NOTE — Telephone Encounter (Signed)
I checked his recent uro note.  If he has stream changes (along with blood in his urine noted on u/a), then I presume this is from a stone.  Did he start flomax?  I think it would be best for him to check with urology locally- they may have specific advice (ie they may have him come in for a f/u [facility]).  I need their help.  Thanks.

## 2021-04-01 NOTE — Telephone Encounter (Signed)
Please call patient and check with him about his abdominal pain.  He saw urology.  See if he has any urinary sx, see if his situation has changed in the meantime, and see what details you can get.  Thanks.

## 2021-04-02 ENCOUNTER — Other Ambulatory Visit: Payer: Self-pay | Admitting: Family Medicine

## 2021-04-02 NOTE — Telephone Encounter (Signed)
Spoke with patient via mychart.

## 2021-04-02 NOTE — Telephone Encounter (Signed)
Please see when patient can get seen here in clinic.  Please schedule next available.  Thanks.

## 2021-04-09 ENCOUNTER — Ambulatory Visit: Payer: PRIVATE HEALTH INSURANCE | Admitting: Family Medicine

## 2021-04-14 ENCOUNTER — Ambulatory Visit (INDEPENDENT_AMBULATORY_CARE_PROVIDER_SITE_OTHER): Payer: PRIVATE HEALTH INSURANCE | Admitting: Family Medicine

## 2021-04-14 ENCOUNTER — Encounter: Payer: Self-pay | Admitting: Family Medicine

## 2021-04-14 ENCOUNTER — Other Ambulatory Visit: Payer: Self-pay

## 2021-04-14 VITALS — BP 120/78 | HR 85 | Temp 97.7°F | Ht 74.0 in | Wt 224.0 lb

## 2021-04-14 DIAGNOSIS — Z0000 Encounter for general adult medical examination without abnormal findings: Secondary | ICD-10-CM

## 2021-04-14 DIAGNOSIS — Z7189 Other specified counseling: Secondary | ICD-10-CM

## 2021-04-14 DIAGNOSIS — Z8249 Family history of ischemic heart disease and other diseases of the circulatory system: Secondary | ICD-10-CM

## 2021-04-14 DIAGNOSIS — G8929 Other chronic pain: Secondary | ICD-10-CM

## 2021-04-14 DIAGNOSIS — E782 Mixed hyperlipidemia: Secondary | ICD-10-CM

## 2021-04-14 DIAGNOSIS — M545 Low back pain, unspecified: Secondary | ICD-10-CM

## 2021-04-14 DIAGNOSIS — R109 Unspecified abdominal pain: Secondary | ICD-10-CM

## 2021-04-14 LAB — COMPREHENSIVE METABOLIC PANEL
ALT: 20 U/L (ref 0–53)
AST: 18 U/L (ref 0–37)
Albumin: 4.7 g/dL (ref 3.5–5.2)
Alkaline Phosphatase: 77 U/L (ref 39–117)
BUN: 8 mg/dL (ref 6–23)
CO2: 32 mEq/L (ref 19–32)
Calcium: 9.3 mg/dL (ref 8.4–10.5)
Chloride: 101 mEq/L (ref 96–112)
Creatinine, Ser: 1.02 mg/dL (ref 0.40–1.50)
GFR: 88.77 mL/min (ref >60.00)
Glucose, Bld: 95 mg/dL (ref 70–99)
Potassium: 4.4 mEq/L (ref 3.5–5.1)
Sodium: 140 mEq/L (ref 135–145)
Total Bilirubin: 0.4 mg/dL (ref 0.2–1.2)
Total Protein: 7.3 g/dL (ref 6.0–8.3)

## 2021-04-14 LAB — LIPID PANEL
Cholesterol: 204 mg/dL — ABNORMAL HIGH (ref 0–200)
HDL: 60.4 mg/dL (ref >39.00)
LDL Cholesterol: 105 mg/dL — ABNORMAL HIGH (ref 0–99)
NonHDL: 143.79
Total CHOL/HDL Ratio: 3
Triglycerides: 195 mg/dL — ABNORMAL HIGH (ref 0.0–149.0)
VLDL: 39 mg/dL (ref 0.0–40.0)

## 2021-04-14 NOTE — Patient Instructions (Addendum)
Go to the lab on the way out.   If you have mychart we'll likely use that to update you.    Please let me know about the results from your pending CT scan.  I will check on options about the CT head and the movantik or similar.   Take care.  Glad to see you.

## 2021-04-14 NOTE — Progress Notes (Signed)
This visit occurred during the SARS-CoV-2 public health emergency.  Safety protocols were in place, including screening questions prior to the visit, additional usage of staff PPE, and extensive cleaning of exam room while observing appropriate contact time as indicated for disinfecting solutions.  CPE- See plan.  Routine anticipatory guidance given to patient.  See health maintenance.  The possibility exists that previously documented standard health maintenance information may have been brought forward from a previous encounter into this note.  If needed, that same information has been updated to reflect the current situation based on today's encounter.    Tetanus 2013 Flu prev done, d/w pt.   Covid prev done.   HIV neg 2016 Colonoscoy 2022 Prostate CA screening not due.  PNA and shingles not due.  Diet and exercise d/w pt.  D/w pt about carb intake.   Wife designated if patient were incapacitated.    Indication for chronic opioid: chronic back pain.  Medication and dose: norco and tramadol.   # pills per month:  see emr.  Last UDS date: 04/14/21  Pain contract signed (Y/N): yes Date narcotic database last reviewed (include red flags): 04/14/21  Constipation d/w pt.  He is trying to manage as is.  D/w pt about movantik or similar agents.  He is putting up with baseline back pain, still stretching and walking.  It is manageable as is.  Not sedated.  He still has relief from pain med, with pain sig increased in the AM when meds have worn off.  Pain improves with med effect.  D/w pt about vascular screening re: family hx, specifically CT screening to check for aneurysm.  See orders.  Mother with history of cerebral aneurysm.  Abd pain.  Prev urology eval.  He had CT eval pending.  He had sharp pain in the L lower back, then radiated to the abdomen.  This isn't his baseline back pain- it feel completely different.  He could have possibly passed a stone.  Discussed.  Elevated  Cholesterol: Using medications without problems: yes Muscle aches: not diffuse aches, see above.   Diet compliance: yes Exercise: yes Labs pending.    PMH and SH reviewed  Meds, vitals, and allergies reviewed.   ROS: Per HPI.  Unless specifically indicated otherwise in HPI, the patient denies:  General: fever. Eyes: acute vision changes ENT: sore throat Cardiovascular: chest pain Respiratory: SOB GI: vomiting GU: dysuria Musculoskeletal: acute back pain Derm: acute rash Neuro: acute motor dysfunction Psych: worsening mood Endocrine: polydipsia Heme: bleeding Allergy: hayfever  GEN: nad, alert and oriented HEENT: ncat NECK: supple w/o LA CV: rrr. PULM: ctab, no inc wob ABD: soft, +bs EXT: no edema SKIN: no acute rash Gait normal.  Sensation intact in lower extremities.  No foot drop.

## 2021-04-15 DIAGNOSIS — R109 Unspecified abdominal pain: Secondary | ICD-10-CM | POA: Insufficient documentation

## 2021-04-15 MED ORDER — SYMPROIC 0.2 MG PO TABS: 0.2000 mg | ORAL_TABLET | Freq: Every day | ORAL | Status: DC

## 2021-04-15 NOTE — Assessment & Plan Note (Signed)
CT ordered for screening.

## 2021-04-15 NOTE — Assessment & Plan Note (Signed)
Will await CT per outside clinic.  It sounds like he could have had a stone.  No pain now.

## 2021-04-15 NOTE — Assessment & Plan Note (Addendum)
Indication for chronic opioid: chronic back pain.  Medication and dose: norco and tramadol.   # pills per month:  see emr.  Last UDS date: 04/14/21  Pain contract signed (Y/N): yes Date narcotic database last reviewed (include red flags): 04/14/21  Constipation d/w pt.  He is trying to manage as is.  D/w pt about movantik or similar agents.  He is putting up with baseline back pain, still stretching and walking.  It is manageable as is.  Not sedated.  He still has relief from pain med, with pain sig increased in the AM when meds have worn off.  Pain improves with med effect.  I will check on options for opiate induced constipation.  See notes on labs.  Continue hydrocodone and tramadol.

## 2021-04-15 NOTE — Assessment & Plan Note (Signed)
See notes on labs.  Continue atorvastatin. 

## 2021-04-15 NOTE — Assessment & Plan Note (Signed)
Wife designated if patient were incapacitated.  

## 2021-04-15 NOTE — Assessment & Plan Note (Signed)
Tetanus 2013 Flu prev done, d/w pt.   Covid prev done.   HIV neg 2016 Colonoscoy 2022 Prostate CA screening not due.  PNA and shingles not due.  Diet and exercise d/w pt.  D/w pt about carb intake.   Wife designated if patient were incapacitated.

## 2021-04-17 ENCOUNTER — Other Ambulatory Visit: Payer: Self-pay | Admitting: Family Medicine

## 2021-04-17 ENCOUNTER — Encounter: Payer: Self-pay | Admitting: Family Medicine

## 2021-04-17 DIAGNOSIS — M549 Dorsalgia, unspecified: Secondary | ICD-10-CM

## 2021-04-18 ENCOUNTER — Encounter: Payer: Self-pay | Admitting: Family Medicine

## 2021-04-18 LAB — DRUG MONITORING, PANEL 8 WITH CONFIRMATION, URINE
6 Acetylmorphine: NEGATIVE ng/mL (ref <10)
Alcohol Metabolites: NEGATIVE ng/mL (ref <500)
Amphetamines: NEGATIVE ng/mL (ref <500)
Benzodiazepines: NEGATIVE ng/mL (ref <100)
Buprenorphine, Urine: NEGATIVE ng/mL (ref <5)
Cocaine Metabolite: NEGATIVE ng/mL (ref <150)
Codeine: NEGATIVE ng/mL (ref <50)
Creatinine: 116.4 mg/dL (ref >=20.0)
Hydrocodone: 326 ng/mL — ABNORMAL HIGH (ref <50)
Hydromorphone: 367 ng/mL — ABNORMAL HIGH (ref <50)
MDMA: NEGATIVE ng/mL (ref <500)
Marijuana Metabolite: 81 ng/mL — ABNORMAL HIGH (ref <5)
Marijuana Metabolite: POSITIVE ng/mL — AB (ref <20)
Morphine: NEGATIVE ng/mL (ref <50)
Norhydrocodone: 2542 ng/mL — ABNORMAL HIGH (ref <50)
Noroxycodone: 102 ng/mL — ABNORMAL HIGH (ref <50)
Opiates: POSITIVE ng/mL — AB (ref <100)
Oxidant: NEGATIVE ug/mL (ref <200)
Oxycodone: NEGATIVE ng/mL (ref <50)
Oxycodone: POSITIVE ng/mL — AB (ref <100)
Oxymorphone: 64 ng/mL — ABNORMAL HIGH (ref <50)
pH: 7.5 (ref 4.5–9.0)

## 2021-04-18 LAB — DM TEMPLATE

## 2021-04-18 NOTE — Telephone Encounter (Signed)
Refill request for Hydrocodone and tramadol  LOV - 04/14/21 Next OV - not scheduled Last refill - Hydrocodone 03/23/21 #90/0                   Tramadol 03/23/21 # 240/0

## 2021-04-19 MED ORDER — TRAMADOL HCL 50 MG PO TABS: 100.0000 mg | ORAL_TABLET | Freq: Four times a day (QID) | ORAL | 0 refills | Status: DC

## 2021-04-19 MED ORDER — HYDROCODONE-ACETAMINOPHEN 10-325 MG PO TABS: ORAL_TABLET | ORAL | 0 refills | Status: DC

## 2021-04-20 ENCOUNTER — Other Ambulatory Visit: Payer: Self-pay | Admitting: Family Medicine

## 2021-04-23 ENCOUNTER — Encounter: Payer: Self-pay | Admitting: Family Medicine

## 2021-05-14 ENCOUNTER — Other Ambulatory Visit: Payer: Self-pay | Admitting: Family Medicine

## 2021-05-14 DIAGNOSIS — M549 Dorsalgia, unspecified: Secondary | ICD-10-CM

## 2021-05-15 NOTE — Telephone Encounter (Signed)
Refill request for Hydrocodone and Tramadol   LOV - 04/14/21 Next OV - not scheduled Last refill - Hydrocodone 04/19/21 #90/0                   Tramadol 04/19/21 #240/0

## 2021-05-17 MED ORDER — HYDROCODONE-ACETAMINOPHEN 10-325 MG PO TABS: ORAL_TABLET | ORAL | 0 refills | Status: DC

## 2021-05-17 MED ORDER — TRAMADOL HCL 50 MG PO TABS: 100.0000 mg | ORAL_TABLET | Freq: Four times a day (QID) | ORAL | 0 refills | Status: DC

## 2021-05-17 NOTE — Telephone Encounter (Signed)
See my chart message.  Please see about getting a copy of his CT report that was done at the outside clinic, in [facility].  Thanks.

## 2021-05-17 NOTE — Telephone Encounter (Signed)
Sent. Thanks.   

## 2021-05-18 ENCOUNTER — Encounter: Payer: Self-pay | Admitting: *Deleted

## 2021-05-18 NOTE — Telephone Encounter (Signed)
Faxed as requested below.

## 2021-05-18 NOTE — Telephone Encounter (Signed)
Anastasiya, can you please print the CT Head Angio order (stamp it) and fax it to [facility]?  The cover letter needs to contain the following information please.   ATTN: [facility]  CT DEPT [address]  [place], [place] NPI: [id number]  (Fax) [phone]  Approved with [facility] via [place] Case# [id number] Auth# [id number] Exp 11/14/21

## 2021-06-04 ENCOUNTER — Encounter: Payer: Self-pay | Admitting: Family Medicine

## 2021-06-11 ENCOUNTER — Encounter: Payer: Self-pay | Admitting: Family Medicine

## 2021-06-11 ENCOUNTER — Other Ambulatory Visit: Payer: Self-pay | Admitting: Family Medicine

## 2021-06-11 DIAGNOSIS — M549 Dorsalgia, unspecified: Secondary | ICD-10-CM

## 2021-06-12 NOTE — Telephone Encounter (Signed)
Refill request for Hydrocodone 10-325 mg tabs and Tramadol 50 mg tabs ? ?LOV - 04/14/2021 ?Next OV - not scheduled ?Last refill - Hydrocodone 05/17/21 #90/0 ?                  Tramadol 05/17/21 #240/0 ? ?

## 2021-06-14 MED ORDER — HYDROCODONE-ACETAMINOPHEN 10-325 MG PO TABS: ORAL_TABLET | ORAL | 0 refills | Status: DC

## 2021-06-14 MED ORDER — TRAMADOL HCL 50 MG PO TABS: 100.0000 mg | ORAL_TABLET | Freq: Four times a day (QID) | ORAL | 0 refills | Status: DC

## 2021-06-14 NOTE — Telephone Encounter (Signed)
Please request a copy of his urology notes.  I sent him a separate note about his CT angiogram.  Thanks. ?

## 2021-06-14 NOTE — Telephone Encounter (Signed)
Sent. Thanks.   

## 2021-06-15 ENCOUNTER — Other Ambulatory Visit: Payer: Self-pay | Admitting: Family Medicine

## 2021-07-09 ENCOUNTER — Other Ambulatory Visit: Payer: Self-pay | Admitting: Family Medicine

## 2021-07-09 DIAGNOSIS — M549 Dorsalgia, unspecified: Secondary | ICD-10-CM

## 2021-07-09 NOTE — Telephone Encounter (Signed)
Refill request for Tramadol and Hydrocodone  ? ?LOV - 04/14/21 ?Next OV - not scheduled ?Last refill - 06/14/21; #240/0 on tramadol and #90/0 on hydrocodone ? ?

## 2021-07-12 MED ORDER — TRAMADOL HCL 50 MG PO TABS: 100.0000 mg | ORAL_TABLET | Freq: Four times a day (QID) | ORAL | 0 refills | Status: DC

## 2021-07-12 MED ORDER — HYDROCODONE-ACETAMINOPHEN 10-325 MG PO TABS: ORAL_TABLET | ORAL | 0 refills | Status: DC

## 2021-07-12 NOTE — Telephone Encounter (Signed)
Sent. Thanks.   

## 2021-07-13 NOTE — Telephone Encounter (Signed)
Records request form has been faxed to [facility] ?

## 2021-07-24 ENCOUNTER — Encounter: Payer: Self-pay | Admitting: Family Medicine

## 2021-07-29 ENCOUNTER — Telehealth: Payer: Self-pay | Admitting: Family Medicine

## 2021-07-29 NOTE — Telephone Encounter (Signed)
Please triage patient about the skin changes he noted on the palms of his hands.  See if he has the same skin changes anywhere else.  See if the redness blanches or itches.  Please let me know what all you find out.  Thanks. ?

## 2021-07-30 NOTE — Telephone Encounter (Signed)
If it resolved, then I wouldn't do anything else now.  It is possible to have episodic capillary dilation in the skin that can cause reddish changes.  But if it resolved I wouldn't suspect anything ominous.  Thanks.  ?

## 2021-07-30 NOTE — Telephone Encounter (Signed)
I spoke with pt who is out of town on vacation. Pt said he noticed the redness on palms 1 1/2 wks ago and it only lasted 2 days. No redness or discolorization now. The redness was on both palms; pt was not sure if skin blanched or not but pt did note if made fist felt tingling sensation in palm area like hand was going to sleep. Pt never had this before and there was no itching or pain. Pt did not have any swelling in hands. No difficulty breathing and no new soaps, detergents, etc.UC & ED precautions given and pt voiced understanding but just to be clear pt is not having any problems now. Sending note to Dr Para March. ?

## 2021-07-30 NOTE — Telephone Encounter (Addendum)
Left v/m requesting pt to cb to [facility]. Sending note to [place] triage and [facility] CMA. ?

## 2021-07-30 NOTE — Telephone Encounter (Signed)
Replied to patient's message via mychart

## 2021-08-03 ENCOUNTER — Encounter: Payer: Self-pay | Admitting: Family Medicine

## 2021-08-05 ENCOUNTER — Telehealth: Payer: Self-pay | Admitting: Family Medicine

## 2021-08-05 NOTE — Telephone Encounter (Signed)
I left pt v/m requesting a cb to [facility] for additional info to my chart note and pt to be triaged. Will also my chart pt. Sending note to [facility] triage and [facility] CMA. ?

## 2021-08-05 NOTE — Telephone Encounter (Signed)
Please call pt about leg pain and see what details you can get, ie location, etc.  Thanks.  ?

## 2021-08-06 ENCOUNTER — Other Ambulatory Visit: Payer: Self-pay | Admitting: Family Medicine

## 2021-08-06 DIAGNOSIS — M549 Dorsalgia, unspecified: Secondary | ICD-10-CM

## 2021-08-06 NOTE — Telephone Encounter (Signed)
Refill request for Tramadol and Hydrocodone  ? ?LOV - 04/14/21 ?Next OV - not scheduled ?Last refill - Tramadol - 07/12/21 #240/0 ?                  Hydrocodone 07/12/21 #90/0 ? ?

## 2021-08-06 NOTE — Telephone Encounter (Addendum)
Unable to reach pt by phone; will send mychart note to pt as well(see pt message on .08/03/21. ?

## 2021-08-06 NOTE — Telephone Encounter (Signed)
Pt responded by pt message on 08/03/21 and that pt message was sent to Dr Para March. ?

## 2021-08-09 MED ORDER — HYDROCODONE-ACETAMINOPHEN 10-325 MG PO TABS
ORAL_TABLET | ORAL | 0 refills | Status: DC
Start: 2021-08-09 — End: 2021-09-03

## 2021-08-09 MED ORDER — TRAMADOL HCL 50 MG PO TABS: 100.0000 mg | ORAL_TABLET | Freq: Four times a day (QID) | ORAL | 0 refills | Status: DC

## 2021-08-09 NOTE — Telephone Encounter (Signed)
Sent. Thanks.   

## 2021-08-10 ENCOUNTER — Other Ambulatory Visit: Payer: Self-pay | Admitting: Family Medicine

## 2021-09-03 ENCOUNTER — Other Ambulatory Visit: Payer: Self-pay | Admitting: Family Medicine

## 2021-09-03 DIAGNOSIS — M549 Dorsalgia, unspecified: Secondary | ICD-10-CM

## 2021-09-04 NOTE — Telephone Encounter (Signed)
Refill request for Hydrocodone and Tramadol  LOV - 04/14/21 Next OV - not scheduled Last refill - Hydrocodone 08/09/21 #90/0                   Tramadol 08/09/21 #240/0

## 2021-09-06 MED ORDER — HYDROCODONE-ACETAMINOPHEN 10-325 MG PO TABS
ORAL_TABLET | ORAL | 0 refills | Status: DC
Start: 2021-09-06 — End: 2021-10-03

## 2021-09-06 MED ORDER — TRAMADOL HCL 50 MG PO TABS: 100.0000 mg | ORAL_TABLET | Freq: Four times a day (QID) | ORAL | 0 refills | Status: DC

## 2021-09-06 NOTE — Telephone Encounter (Signed)
Sent. Thanks.   

## 2021-10-03 ENCOUNTER — Other Ambulatory Visit: Payer: Self-pay | Admitting: Family Medicine

## 2021-10-03 DIAGNOSIS — M549 Dorsalgia, unspecified: Secondary | ICD-10-CM

## 2021-10-04 ENCOUNTER — Encounter: Payer: Self-pay | Admitting: Family Medicine

## 2021-10-05 ENCOUNTER — Other Ambulatory Visit: Payer: Self-pay | Admitting: Family Medicine

## 2021-10-05 MED ORDER — HYDROCODONE-ACETAMINOPHEN 10-325 MG PO TABS: ORAL_TABLET | ORAL | 0 refills | Status: DC

## 2021-10-05 MED ORDER — TRAMADOL HCL 50 MG PO TABS: 100.0000 mg | ORAL_TABLET | Freq: Four times a day (QID) | ORAL | 0 refills | Status: DC

## 2021-10-05 NOTE — Telephone Encounter (Signed)
Sent. Thanks.   

## 2021-10-05 NOTE — Telephone Encounter (Signed)
Refill request for Hydrocodone 10-325mg  tabs and tramadol 50 mg tabs  LOV - 04/14/21 Next OV - not scheduled Last refill - Hydrocodone - 09/06/21 #90/0                   Tramadol 09/06/21 #240/0

## 2021-10-30 ENCOUNTER — Other Ambulatory Visit: Payer: Self-pay | Admitting: Family Medicine

## 2021-10-30 NOTE — Telephone Encounter (Signed)
Refill request for Hydrocodone 10-325 mg tabs and Tramadol 50 mg tablets  LOV - 04/14/21 Next OV - not scheduled Last refill - Hydrocodone 10/05/21 #90/0                   Tramadol 10/05/21 #240/0

## 2021-11-01 MED ORDER — HYDROCODONE-ACETAMINOPHEN 10-325 MG PO TABS: ORAL_TABLET | ORAL | 0 refills | Status: DC

## 2021-11-01 MED ORDER — TRAMADOL HCL 50 MG PO TABS: 100.0000 mg | ORAL_TABLET | Freq: Four times a day (QID) | ORAL | 0 refills | Status: DC

## 2021-11-02 ENCOUNTER — Other Ambulatory Visit: Payer: Self-pay | Admitting: Family Medicine

## 2021-11-24 ENCOUNTER — Other Ambulatory Visit: Payer: Self-pay | Admitting: Family Medicine

## 2021-11-24 NOTE — Telephone Encounter (Signed)
Patient is due for f/u this fall. Please call to schedule

## 2021-11-24 NOTE — Telephone Encounter (Signed)
Patient scheduled.

## 2021-11-28 ENCOUNTER — Other Ambulatory Visit: Payer: Self-pay | Admitting: Family Medicine

## 2021-11-28 ENCOUNTER — Encounter: Payer: Self-pay | Admitting: Family Medicine

## 2021-11-30 NOTE — Telephone Encounter (Signed)
Refill request for HYDROcodone-acetaminophen (NORCO) 10-325 MG tablet and Tramadol 50 mg tabs  LOV - 04/14/21 Next OV - 12/21/21 Last refill - Hydrocodone 11/01/21 #90/0                  Tramadol 11/01/21 #240/0  *Patient also sent a mychart message about going out of town*

## 2021-11-30 NOTE — Telephone Encounter (Signed)
Refill request has also been sent to you.

## 2021-12-01 MED ORDER — TRAMADOL HCL 50 MG PO TABS: 100.0000 mg | ORAL_TABLET | Freq: Four times a day (QID) | ORAL | 0 refills | Status: DC

## 2021-12-01 MED ORDER — HYDROCODONE-ACETAMINOPHEN 10-325 MG PO TABS: ORAL_TABLET | ORAL | 0 refills | Status: DC

## 2021-12-01 NOTE — Telephone Encounter (Signed)
Rx sent for 5-day supply.  Please check with pharmacy to see if they will fill it.  Thanks.

## 2021-12-02 MED ORDER — TRAMADOL HCL 50 MG PO TABS: 100.0000 mg | ORAL_TABLET | Freq: Four times a day (QID) | ORAL | 0 refills | Status: DC

## 2021-12-02 MED ORDER — HYDROCODONE-ACETAMINOPHEN 10-325 MG PO TABS: ORAL_TABLET | ORAL | 0 refills | Status: DC

## 2021-12-12 ENCOUNTER — Encounter: Payer: Self-pay | Admitting: Family Medicine

## 2021-12-20 ENCOUNTER — Telehealth: Payer: Self-pay | Admitting: Family Medicine

## 2021-12-20 NOTE — Telephone Encounter (Signed)
Called and LMOVM for patient about rescheduling.  Mychart message sent to patient.

## 2021-12-21 ENCOUNTER — Ambulatory Visit: Payer: PRIVATE HEALTH INSURANCE | Admitting: Family Medicine

## 2021-12-30 ENCOUNTER — Other Ambulatory Visit: Payer: Self-pay | Admitting: Family Medicine

## 2021-12-30 MED ORDER — HYDROCODONE-ACETAMINOPHEN 10-325 MG PO TABS: ORAL_TABLET | ORAL | 0 refills | Status: DC

## 2021-12-30 MED ORDER — TRAMADOL HCL 50 MG PO TABS
100.0000 mg | ORAL_TABLET | Freq: Four times a day (QID) | ORAL | 0 refills | Status: DC
Start: 2021-12-30 — End: 2022-01-28

## 2021-12-30 NOTE — Telephone Encounter (Signed)
Refill request for HYDROcodone-acetaminophen (NORCO) 10-325 MG tablet and tramadol 50 mg tabs  LOV - 04/14/21 Next OV - 01/11/22 Last refill - hydrocodone 12/02/21 #90/0                   Tramadol 12/02/21 #240/0

## 2021-12-31 ENCOUNTER — Ambulatory Visit: Payer: PRIVATE HEALTH INSURANCE | Admitting: Family Medicine

## 2022-01-11 ENCOUNTER — Ambulatory Visit: Payer: PRIVATE HEALTH INSURANCE | Admitting: Family Medicine

## 2022-01-21 ENCOUNTER — Encounter: Payer: Self-pay | Admitting: Family Medicine

## 2022-01-21 ENCOUNTER — Ambulatory Visit (INDEPENDENT_AMBULATORY_CARE_PROVIDER_SITE_OTHER): Payer: PRIVATE HEALTH INSURANCE | Admitting: Family Medicine

## 2022-01-21 VITALS — BP 116/78 | HR 107 | Temp 97.0°F | Ht 74.0 in | Wt 223.0 lb

## 2022-01-21 DIAGNOSIS — E785 Hyperlipidemia, unspecified: Secondary | ICD-10-CM

## 2022-01-21 DIAGNOSIS — M545 Low back pain, unspecified: Secondary | ICD-10-CM

## 2022-01-21 DIAGNOSIS — G8929 Other chronic pain: Secondary | ICD-10-CM

## 2022-01-21 DIAGNOSIS — L989 Disorder of the skin and subcutaneous tissue, unspecified: Secondary | ICD-10-CM

## 2022-01-21 DIAGNOSIS — Z23 Encounter for immunization: Secondary | ICD-10-CM

## 2022-01-21 DIAGNOSIS — R319 Hematuria, unspecified: Secondary | ICD-10-CM | POA: Diagnosis not present

## 2022-01-21 LAB — LIPID PANEL
Cholesterol: 203 mg/dL — ABNORMAL HIGH (ref 0–200)
HDL: 59.3 mg/dL (ref >39.00)
NonHDL: 143.41
Total CHOL/HDL Ratio: 3
Triglycerides: 203 mg/dL — ABNORMAL HIGH (ref 0.0–149.0)
VLDL: 40.6 mg/dL — ABNORMAL HIGH (ref 0.0–40.0)

## 2022-01-21 LAB — LDL CHOLESTEROL, DIRECT: Direct LDL: 116 mg/dL

## 2022-01-21 MED ORDER — LINACLOTIDE 72 MCG PO CAPS: 72.0000 ug | ORAL_CAPSULE | Freq: Every day | ORAL | 1 refills | Status: DC

## 2022-01-21 NOTE — Patient Instructions (Addendum)
Go to the lab on the way out.   If you have mychart we'll likely use that to update you.    Let me see about getting the neurosurgery and dermatology appointments set up.  Take care.  Glad to see you.  Change to linzess and let me know how that goes in about 10 days.

## 2022-01-21 NOTE — Progress Notes (Signed)
D/w pt about GI eval and sx.  Constipation, presumed opioid induced.  Used enema prn with relief.  On relistor but linzess may have worked better.  Discussed restart of linzess.  Not sedated.  He has used opiates for years for residual back pain after previous surgery.  His current prescription has allowed him to be functional.  He has been compliant regarding refills/appropriate use.  He is still stretching at baseline.  He has had sig pain with long car rides.  He clearly improved with prev surgery years ago,   Urology eval d/w pt.  He doesn't have gross hematuria.  He had dec libido on flomax.    Skin lesion. 38mm macule on the back.  Light and dark pigment noted.  D/w pt about derm eval.   He doesn't have diffuse aches from statin- it appears that his back pain is a separate issue i.e. as above.  Recheck labs pending.  Compliant with Lipitor.  Meds, vitals, and allergies reviewed.   ROS: Per HPI unless specifically indicated in ROS section   Nad Ncat Neck supple,no LA Rrr Ctab Upper abd ttp w/o rebound.  Lower abdomen nontender. SLR neg B.   No pain twisting trunk left or right. Skin lesion noted on the mid back.  4 mm macule with pigment variation.  30 minutes were devoted to patient care in this encounter (this includes time spent reviewing the patient's file/history, interviewing and examining the patient, counseling/reviewing plan with patient).

## 2022-01-23 ENCOUNTER — Encounter: Payer: Self-pay | Admitting: Family Medicine

## 2022-01-24 NOTE — Assessment & Plan Note (Signed)
With previous urology evaluation done.

## 2022-01-24 NOTE — Assessment & Plan Note (Signed)
Unfortunately he has had longstanding back pain.  He previously improved with surgery but he still had residual back pain requiring opiate treatment over the years.  He is compliant.  He is having significant constipation.  Reasonable to restart Linzess then let me know how that goes.  We may need to adjust his dose going forward.  At this point is reasonable to talk to neurosurgery to see what else can be done in terms of injection or ablation to see if that will help with the pain and decrease his pain level/opiate requirement.  He agrees with plan.  Referral placed.

## 2022-01-24 NOTE — Assessment & Plan Note (Signed)
Refer to dermatology 

## 2022-01-24 NOTE — Assessment & Plan Note (Signed)
Continue Lipitor.  See notes on labs. 

## 2022-01-25 ENCOUNTER — Encounter: Payer: Self-pay | Admitting: Family Medicine

## 2022-01-28 ENCOUNTER — Encounter: Payer: Self-pay | Admitting: Family Medicine

## 2022-01-28 ENCOUNTER — Other Ambulatory Visit: Payer: Self-pay | Admitting: Family Medicine

## 2022-01-28 NOTE — Telephone Encounter (Signed)
Refill request for HYDROcodone-acetaminophen (NORCO) 10-325 MG tablet and tramadol 50 mg tablets  LOV - 12/30/21 Next OV - not scheduled Last refill - Both filled on 12/30/21

## 2022-01-29 MED ORDER — TRAMADOL HCL 50 MG PO TABS: 100.0000 mg | ORAL_TABLET | Freq: Four times a day (QID) | ORAL | 0 refills | Status: DC

## 2022-01-29 MED ORDER — HYDROCODONE-ACETAMINOPHEN 10-325 MG PO TABS: ORAL_TABLET | ORAL | 0 refills | Status: DC

## 2022-01-29 NOTE — Telephone Encounter (Signed)
Sent. Thanks.   

## 2022-02-21 ENCOUNTER — Encounter: Payer: Self-pay | Admitting: Family Medicine

## 2022-02-26 ENCOUNTER — Other Ambulatory Visit: Payer: Self-pay | Admitting: Family Medicine

## 2022-03-01 ENCOUNTER — Encounter: Payer: Self-pay | Admitting: Family Medicine

## 2022-03-01 ENCOUNTER — Telehealth: Payer: Self-pay | Admitting: Family Medicine

## 2022-03-01 MED ORDER — HYDROCODONE-ACETAMINOPHEN 10-325 MG PO TABS: ORAL_TABLET | ORAL | 0 refills | Status: DC

## 2022-03-01 MED ORDER — TRAMADOL HCL 50 MG PO TABS: 100.0000 mg | ORAL_TABLET | Freq: Four times a day (QID) | ORAL | 0 refills | Status: DC

## 2022-03-01 NOTE — Telephone Encounter (Signed)
Sent. Thanks.   

## 2022-03-01 NOTE — Telephone Encounter (Signed)
Refill request for HYDROcodone-acetaminophen (NORCO) 10-325 MG tablet and   traMADol (ULTRAM) 50 MG tablet    LOV - 01/21/22 Next OV - not scheduled Last refill - Hydrocodone 01/29/22 #90/0                   Tramadol 01/29/22 #240/0

## 2022-03-01 NOTE — Telephone Encounter (Signed)
Patient called and stated that he sent a message before Thanksgiving if he could get the medications traMADol (ULTRAM) 50 MG tablet , HYDROcodone-acetaminophen (NORCO) 10-325 MG tablet because he is all out and sent to  [facility] - [place], [place] - [address] Phone: [phone]  Fax: [phone]    . Call back number [phone].

## 2022-03-01 NOTE — Telephone Encounter (Signed)
Patient notified rxs sent. 

## 2022-03-15 MED ORDER — AMITRIPTYLINE HCL 10 MG PO TABS: ORAL_TABLET | ORAL | 1 refills | Status: DC

## 2022-03-24 ENCOUNTER — Other Ambulatory Visit: Payer: Self-pay | Admitting: Family Medicine

## 2022-03-24 ENCOUNTER — Encounter: Payer: Self-pay | Admitting: Family Medicine

## 2022-03-25 NOTE — Telephone Encounter (Signed)
Refill request for HYDROcodone-acetaminophen (NORCO) 10-325 MG tablet and Tramadol 50 mg tabs  LOV - 01/21/22 Next OV - not scheduled Last refill - Hydrocodone 03/01/22 #90/0                   Tramadol #240/0

## 2022-03-26 MED ORDER — HYDROCODONE-ACETAMINOPHEN 10-325 MG PO TABS: ORAL_TABLET | ORAL | 0 refills | Status: DC

## 2022-03-26 MED ORDER — TRAMADOL HCL 50 MG PO TABS: 100.0000 mg | ORAL_TABLET | Freq: Four times a day (QID) | ORAL | 0 refills | Status: DC

## 2022-03-26 NOTE — Telephone Encounter (Signed)
Sent. Thanks.   

## 2022-04-21 ENCOUNTER — Other Ambulatory Visit: Payer: Self-pay | Admitting: Family Medicine

## 2022-04-22 NOTE — Telephone Encounter (Signed)
Refill request for HYDROcodone-acetaminophen (NORCO) 10-325 MG tablet and Tramadol 50 mg tabs  LOV - 01/21/22 Next OV - not scheduled Last refill - Hydrocodone 03/26/22 #90/0                   Tramadol 03/26/22 #240/0

## 2022-04-23 MED ORDER — HYDROCODONE-ACETAMINOPHEN 10-325 MG PO TABS: ORAL_TABLET | ORAL | 0 refills | Status: DC

## 2022-04-23 MED ORDER — TRAMADOL HCL 50 MG PO TABS: 100.0000 mg | ORAL_TABLET | Freq: Four times a day (QID) | ORAL | 0 refills | Status: DC

## 2022-04-24 ENCOUNTER — Other Ambulatory Visit: Payer: Self-pay | Admitting: Family Medicine

## 2022-04-26 ENCOUNTER — Encounter: Payer: Self-pay | Admitting: Family Medicine

## 2022-04-26 ENCOUNTER — Other Ambulatory Visit: Payer: Self-pay

## 2022-04-26 NOTE — Telephone Encounter (Signed)
Refill request amitriptyline Last refill 03/15/22 #90/1 Last office visit 01/21/22

## 2022-04-26 NOTE — Telephone Encounter (Signed)
Last visit 01/21/2022 Next visit 05/07/2022 Last refill 03/15/2022 # 90 with 1 refill

## 2022-04-27 ENCOUNTER — Telehealth: Payer: Self-pay

## 2022-04-27 NOTE — Telephone Encounter (Signed)
Request to submit PA has been sent to Rx prior auth team   CPE scheduled 05/11/22

## 2022-04-27 NOTE — Telephone Encounter (Signed)
PA is needed for Hydrocodone-acetaminophen (Norco).  Please submit.

## 2022-04-28 NOTE — Telephone Encounter (Signed)
Please talk to me about this, re: status.  Thanks for working on this.

## 2022-04-28 NOTE — Telephone Encounter (Signed)
PA had to be done via faxed form. I printed off required form, filled it out, printed OV notes and faxed off. Waiting for determination

## 2022-04-28 NOTE — Telephone Encounter (Signed)
Sent. Thanks.   

## 2022-04-28 NOTE — Telephone Encounter (Signed)
See other note

## 2022-04-30 ENCOUNTER — Other Ambulatory Visit: Payer: Self-pay | Admitting: Family Medicine

## 2022-04-30 MED ORDER — HYDROCODONE-ACETAMINOPHEN 10-325 MG PO TABS: ORAL_TABLET | ORAL | 0 refills | Status: DC

## 2022-05-10 ENCOUNTER — Other Ambulatory Visit: Payer: Self-pay | Admitting: Family Medicine

## 2022-05-11 ENCOUNTER — Ambulatory Visit (INDEPENDENT_AMBULATORY_CARE_PROVIDER_SITE_OTHER): Payer: PRIVATE HEALTH INSURANCE | Admitting: Family Medicine

## 2022-05-11 ENCOUNTER — Encounter: Payer: Self-pay | Admitting: Family Medicine

## 2022-05-11 VITALS — BP 110/68 | HR 98 | Temp 97.0°F | Ht 74.0 in | Wt 224.0 lb

## 2022-05-11 DIAGNOSIS — E785 Hyperlipidemia, unspecified: Secondary | ICD-10-CM

## 2022-05-11 DIAGNOSIS — Z0000 Encounter for general adult medical examination without abnormal findings: Secondary | ICD-10-CM

## 2022-05-11 DIAGNOSIS — M545 Low back pain, unspecified: Secondary | ICD-10-CM

## 2022-05-11 DIAGNOSIS — R109 Unspecified abdominal pain: Secondary | ICD-10-CM

## 2022-05-11 DIAGNOSIS — Z23 Encounter for immunization: Secondary | ICD-10-CM | POA: Diagnosis not present

## 2022-05-11 DIAGNOSIS — Z7189 Other specified counseling: Secondary | ICD-10-CM

## 2022-05-11 LAB — CBC WITH DIFFERENTIAL/PLATELET
Basophils Absolute: 0.1 10*3/uL (ref 0.0–0.1)
Basophils Relative: 1 % (ref 0.0–3.0)
Eosinophils Absolute: 0.6 10*3/uL (ref 0.0–0.7)
Eosinophils Relative: 9.7 % — ABNORMAL HIGH (ref 0.0–5.0)
HCT: 40.3 % (ref 39.0–52.0)
Hemoglobin: 13.8 g/dL (ref 13.0–17.0)
Lymphocytes Relative: 23.6 % (ref 12.0–46.0)
Lymphs Abs: 1.4 10*3/uL (ref 0.7–4.0)
MCHC: 34.3 g/dL (ref 30.0–36.0)
MCV: 88 fl (ref 78.0–100.0)
Monocytes Absolute: 0.5 10*3/uL (ref 0.1–1.0)
Monocytes Relative: 7.9 % (ref 3.0–12.0)
Neutro Abs: 3.5 10*3/uL (ref 1.4–7.7)
Neutrophils Relative %: 57.8 % (ref 43.0–77.0)
Platelets: 329 10*3/uL (ref 150.0–400.0)
RBC: 4.59 Mil/uL (ref 4.22–5.81)
RDW: 13.3 % (ref 11.5–15.5)
WBC: 6.1 10*3/uL (ref 4.0–10.5)

## 2022-05-11 LAB — COMPREHENSIVE METABOLIC PANEL
ALT: 17 U/L (ref 0–53)
AST: 15 U/L (ref 0–37)
Albumin: 4.9 g/dL (ref 3.5–5.2)
Alkaline Phosphatase: 78 U/L (ref 39–117)
BUN: 10 mg/dL (ref 6–23)
CO2: 30 mEq/L (ref 19–32)
Calcium: 9.4 mg/dL (ref 8.4–10.5)
Chloride: 100 mEq/L (ref 96–112)
Creatinine, Ser: 0.88 mg/dL (ref 0.40–1.50)
GFR: 103.07 mL/min (ref >60.00)
Glucose, Bld: 88 mg/dL (ref 70–99)
Potassium: 4.2 mEq/L (ref 3.5–5.1)
Sodium: 141 mEq/L (ref 135–145)
Total Bilirubin: 0.5 mg/dL (ref 0.2–1.2)
Total Protein: 7.6 g/dL (ref 6.0–8.3)

## 2022-05-11 LAB — LIPID PANEL
Cholesterol: 229 mg/dL — ABNORMAL HIGH (ref 0–200)
HDL: 56.2 mg/dL (ref >39.00)
NonHDL: 173.29
Total CHOL/HDL Ratio: 4
Triglycerides: 217 mg/dL — ABNORMAL HIGH (ref 0.0–149.0)
VLDL: 43.4 mg/dL — ABNORMAL HIGH (ref 0.0–40.0)

## 2022-05-11 LAB — TSH: TSH: 1.34 u[IU]/mL (ref 0.35–5.50)

## 2022-05-11 LAB — LDL CHOLESTEROL, DIRECT: Direct LDL: 140 mg/dL

## 2022-05-11 MED ORDER — FAMOTIDINE 20 MG PO TABS: 20.0000 mg | ORAL_TABLET | Freq: Two times a day (BID) | ORAL | Status: AC

## 2022-05-11 NOTE — Progress Notes (Unsigned)
CPE- See plan.  Routine anticipatory guidance given to patient.  See health maintenance.  The possibility exists that previously documented standard health maintenance information may have been brought forward from a previous encounter into this note.  If needed, that same information has been updated to reflect the current situation based on today's encounter.     Tetanus 2024 Flu prev done, d/w pt.   Covid prev done.   HIV neg 2016 Colonoscoy 2021- check on follow up date Prostate CA screening not due.  PNA and shingles not due.  Diet and exercise d/w pt.   Wife designated if patient were incapacitated.  He is still having L sided discomfort w/o improvement with tx for constipation.  Prev CTAP d/w pt.  Discussed dairy, diet, etc.  See avs.    Elevated Cholesterol: Using medications without problems: yes Muscle aches: not likely from statin.   Diet compliance: yes Exercise: d/w pt.   He had recent benign dermatology eval.   He had urology eval, d/w pt.    Chronic back pain.  He is still putting up with pain and considering options.  He is worried about another surgery.  D/w pt.  He hasn't been able to golf.  He is going to see neurosurgery clinic next week.  Discussed GI sx, constipation.  Linzess helped.  He is taking dulcolax prn.  He is taking plenty of water in the meantime.    Dw pt about prev unremarkable CTA of the intracranial circulation.  PMH and SH reviewed  Meds, vitals, and allergies reviewed.   ROS: Per HPI.  Unless specifically indicated otherwise in HPI, the patient denies:  General: fever. Eyes: acute vision changes ENT: sore throat Cardiovascular: chest pain Respiratory: SOB GI: vomiting GU: dysuria Musculoskeletal: acute back pain Derm: acute rash Neuro: acute motor dysfunction Psych: worsening mood Endocrine: polydipsia Heme: bleeding Allergy: hayfever  GEN: nad, alert and oriented HEENT: mucous membranes moist NECK: supple w/o LA CV:  rrr. PULM: ctab, no inc wob ABD: soft, +bs EXT: no edema SKIN: no acute rash

## 2022-05-11 NOTE — Patient Instructions (Addendum)
Go to the lab on the way out.   If you have mychart we'll likely use that to update you.    Take care.  Glad to see you. I would stop diary and see if the abdominal symptoms are better.   If not, then add on pepcid 20mg  a day.   Let me know how that goes.

## 2022-05-12 ENCOUNTER — Other Ambulatory Visit: Payer: Self-pay | Admitting: Family Medicine

## 2022-05-12 MED ORDER — ALFUZOSIN HCL ER 10 MG PO TB24: 10.0000 mg | ORAL_TABLET | Freq: Every day | ORAL | Status: AC

## 2022-05-12 NOTE — Assessment & Plan Note (Signed)
He has been dealing with chronic pain for years.  His previous surgery did help but he has still been requiring opiate treatment in the long run.  His pain has been worse in the meantime and he has not been able to play golf or be as active as he would like, with his kids.  He is going to have follow-up next week to see about options.  I will await the follow-up note.  Linzess did help with constipation and I think it makes sense to continue that for now.  Continue hydrocodone and tramadol for now.  See notes on labs.  Not sedated.

## 2022-05-12 NOTE — Assessment & Plan Note (Signed)
Tetanus 2024 Flu prev done, d/w pt.   Covid prev done.   HIV neg 2016 Colonoscopy 2021- check on follow up date Prostate CA screening not due.  PNA and shingles not due.  Diet and exercise d/w pt.   Wife designated if patient were incapacitated.

## 2022-05-12 NOTE — Assessment & Plan Note (Signed)
See notes on labs.  Continue atorvastatin for now.

## 2022-05-12 NOTE — Assessment & Plan Note (Signed)
Wife designated if patient were incapacitated.  

## 2022-05-12 NOTE — Assessment & Plan Note (Signed)
Noted at night, he has been eating ice cream at night.  Discussed stopping dairy temporarily.  If no improvement with that then add on Pepcid twice a day.  If no improvement with that then let me know.  Given his previous imaging/workup/history and his physical exam I do not suspect an ominous process.

## 2022-05-13 LAB — DRUG MONITORING, PANEL 8 WITH CONFIRMATION, URINE
6 Acetylmorphine: NEGATIVE ng/mL (ref <10)
Alcohol Metabolites: NEGATIVE ng/mL (ref <500)
Amphetamines: NEGATIVE ng/mL (ref <500)
Benzodiazepines: NEGATIVE ng/mL (ref <100)
Buprenorphine, Urine: NEGATIVE ng/mL (ref <5)
Cocaine Metabolite: NEGATIVE ng/mL (ref <150)
Codeine: NEGATIVE ng/mL (ref <50)
Creatinine: 46.4 mg/dL (ref >=20.0)
Hydrocodone: 237 ng/mL — ABNORMAL HIGH (ref <50)
Hydromorphone: 132 ng/mL — ABNORMAL HIGH (ref <50)
MDMA: NEGATIVE ng/mL (ref <500)
Marijuana Metabolite: 763 ng/mL — ABNORMAL HIGH (ref <5)
Marijuana Metabolite: POSITIVE ng/mL — AB (ref <20)
Morphine: NEGATIVE ng/mL (ref <50)
Norhydrocodone: 1081 ng/mL — ABNORMAL HIGH (ref <50)
Opiates: POSITIVE ng/mL — AB (ref <100)
Oxidant: NEGATIVE ug/mL (ref <200)
Oxycodone: NEGATIVE ng/mL (ref <100)
pH: 7.1 (ref 4.5–9.0)

## 2022-05-13 LAB — DM TEMPLATE

## 2022-05-14 ENCOUNTER — Encounter: Payer: Self-pay | Admitting: Family Medicine

## 2022-05-16 ENCOUNTER — Encounter: Payer: Self-pay | Admitting: Family Medicine

## 2022-05-18 ENCOUNTER — Encounter: Payer: Self-pay | Admitting: Family Medicine

## 2022-05-22 ENCOUNTER — Other Ambulatory Visit: Payer: Self-pay | Admitting: Family Medicine

## 2022-05-24 ENCOUNTER — Encounter: Payer: Self-pay | Admitting: Family Medicine

## 2022-05-24 NOTE — Telephone Encounter (Signed)
Refill request for HYDROcodone-acetaminophen (NORCO) 10-325 MG tablet   LOV - 05/11/22 Next OV - not scheduled Last refill - 04/30/22 #90/0

## 2022-05-24 NOTE — Telephone Encounter (Signed)
Refill request for traMADol (ULTRAM) 50 MG tablet   LOV - 05/11/22 Next OV - not scheduled Last refill - 04/23/22 #240/0

## 2022-05-25 MED ORDER — TRAMADOL HCL 50 MG PO TABS: 100.0000 mg | ORAL_TABLET | Freq: Four times a day (QID) | ORAL | 0 refills | Status: AC

## 2022-05-25 MED ORDER — HYDROCODONE-ACETAMINOPHEN 10-325 MG PO TABS: ORAL_TABLET | ORAL | 0 refills | Status: AC

## 2022-05-25 NOTE — Telephone Encounter (Signed)
Sent. Thanks.   

## 2022-05-25 NOTE — Telephone Encounter (Signed)
Patient called back and asked if there was someone that can fill while Dr Damita Dunnings is out

## 2022-06-08 ENCOUNTER — Encounter: Payer: Self-pay | Admitting: Family Medicine

## 2022-06-08 NOTE — Telephone Encounter (Signed)
Please triage patient and update me.  Thanks.  

## 2022-06-08 NOTE — Telephone Encounter (Signed)
Both Regina and I have called and left patient messages asking him to call office.  Rollene Fare also sent a [employer].  Will let you know when he calls [place] back.

## 2022-06-08 NOTE — Telephone Encounter (Signed)
Spoke to patient by telephone and was advised that he never had any chest pain. Patient stated that this has happened off and on for several years. Patient stated that it is hard to explain but it felt like his heart may jump out of his chest. Patient stated that he has worn a heart monitor in the past and the machine never picked the flutter up. Patient stated that the feeling has happened the past two nights in a row. Patient stated that he felt his pulse and it felt fast at the time. Patient stated that the spell may have caused some anxiety. Patient denies no symptoms at this time.Patient stated that he drinks caffeine but not after 3:00 pm..

## 2022-06-23 ENCOUNTER — Other Ambulatory Visit: Payer: Self-pay | Admitting: Family Medicine

## 2022-07-28 ENCOUNTER — Encounter: Payer: Self-pay | Admitting: Family Medicine

## 2022-08-04 ENCOUNTER — Other Ambulatory Visit: Payer: Self-pay | Admitting: Family Medicine

## 2022-08-04 MED ORDER — LINACLOTIDE 145 MCG PO CAPS: 145.0000 ug | ORAL_CAPSULE | Freq: Every day | ORAL | 1 refills | Status: AC

## 2022-08-11 ENCOUNTER — Other Ambulatory Visit: Payer: Self-pay | Admitting: Family Medicine

## 2022-08-25 ENCOUNTER — Encounter: Payer: Self-pay | Admitting: Family Medicine

## 2022-09-02 ENCOUNTER — Other Ambulatory Visit: Payer: Self-pay | Admitting: Family Medicine

## 2022-09-16 ENCOUNTER — Ambulatory Visit: Payer: PRIVATE HEALTH INSURANCE | Admitting: Family Medicine

## 2022-09-27 ENCOUNTER — Encounter: Payer: Self-pay | Admitting: Family Medicine

## 2022-09-27 ENCOUNTER — Ambulatory Visit: Payer: PRIVATE HEALTH INSURANCE | Admitting: Family Medicine

## 2022-09-27 VITALS — BP 110/64 | HR 97 | Temp 97.3°F | Ht 74.0 in | Wt 224.0 lb

## 2022-09-27 DIAGNOSIS — R109 Unspecified abdominal pain: Secondary | ICD-10-CM | POA: Diagnosis not present

## 2022-09-27 MED ORDER — QUETIAPINE FUMARATE 25 MG PO TABS: 25.0000 mg | ORAL_TABLET | Freq: Every day | ORAL | Status: AC

## 2022-09-27 MED ORDER — BUPRENORPHINE 20 MCG/HR TD PTWK: 1.0000 | MEDICATED_PATCH | TRANSDERMAL | Status: AC

## 2022-09-27 NOTE — Patient Instructions (Signed)
Stop the lipitor for 2 weeks.  Either way, let me know if you see any changes.   If no change off lipitor, then restart.    See if the colonoscopy prep changes the symptoms.  Take care.  Glad to see you.

## 2022-09-27 NOTE — Progress Notes (Unsigned)
LUQ abd pain. Longstanding and more frequent.  Waxes and wanes.  When present, worse with deep breath and with sneeze.    He had T spine MRI in anticipation of spinal stimulator.  Med list updated.      Meds, vitals, and allergies reviewed.   ROS: Per HPI unless specifically indicated in ROS section   MRI THORACIC SPINE WO CONTRAST  HISTORY: Mid-back pain Pre-Surgical Eval  COMPARISON: None available.  TECHNIQUE: Multiplanar multisequence MR imaging of the thoracic spine were without intravenous Gadolinium contrast material.    FINDINGS: No fracture. The conus medullaris terminates at a normal level. No abnormal signal in the spinal cord. Small hemangiomas are seen in the T3 and T6 vertebral bodies.  Normal alignment. Minimal disc height loss of the thoracic spine with tiny endplate osteophytes. No significant spinal canal or neural foraminal narrowing throughout.   Not ttp but noted with deep breath.

## 2022-09-29 NOTE — Assessment & Plan Note (Addendum)
Left upper quadrant abdominal discomfort.  He has GI follow-up pending.  Discussed that he could have a functional gastrointestinal issue versus a medication induced source, i.e. he is on opiates.  It would be less likely for his back to be causing his symptoms as the skin is not hypersensitive in a dermatomal distribution and he had a reassuring thoracic spine MRI.  It is possible that he could have abdominal wall/muscle irritation contributing to his pain.  It is also possible that Lipitor could cause muscle aches though that would be atypical.  Discussed options and I asked him to stop the lipitor for 2 weeks.  Either way, he can let me know if he notes any changes.   If no change off lipitor, then restart.   I asked him to see if the colonoscopy prep changes the symptoms.   I will await GI input and update from patient.  Okay for outpatient follow-up.  25 minutes were devoted to patient care in this encounter (this includes time spent reviewing the patient's file/history, interviewing and examining the patient, counseling/reviewing plan with patient).

## 2022-11-03 LAB — HM COLONOSCOPY

## 2022-11-07 ENCOUNTER — Encounter: Payer: Self-pay | Admitting: Family Medicine

## 2022-11-09 ENCOUNTER — Other Ambulatory Visit: Payer: Self-pay | Admitting: Family Medicine

## 2023-01-26 ENCOUNTER — Other Ambulatory Visit: Payer: Self-pay | Admitting: Family Medicine

## 2023-03-16 ENCOUNTER — Encounter: Payer: Self-pay | Admitting: Family Medicine

## 2023-04-04 ENCOUNTER — Other Ambulatory Visit: Payer: Self-pay | Admitting: Family Medicine

## 2023-05-04 ENCOUNTER — Other Ambulatory Visit: Payer: Self-pay | Admitting: Family Medicine

## 2023-05-04 NOTE — Telephone Encounter (Signed)
Please verify continued/current med use (along with other listed meds) and I'll work on the rx.  Thanks.

## 2023-05-04 NOTE — Telephone Encounter (Signed)
Last office visit: 09/27/22 Next office visit: nothing scheduled Last refill: amitriptyline (ELAVIL) 10 MG tablet 01/26/23 90 tablets 1 refill

## 2023-05-05 NOTE — Telephone Encounter (Signed)
Left message for patient to return call to verify what medications he is taking

## 2023-05-09 NOTE — Telephone Encounter (Signed)
 Left voicemail for patient to return call to office.

## 2023-05-13 NOTE — Telephone Encounter (Signed)
 Left voicemail for patient to return call to office.

## 2023-05-16 NOTE — Telephone Encounter (Signed)
 Left voicemail for patient to return call to office.  Will send letter

## 2024-04-15 ENCOUNTER — Encounter: Payer: Self-pay | Admitting: Family Medicine
